# Patient Record
Sex: Female | Born: 1965 | Race: White | Hispanic: No | Marital: Married | Smoking: Never smoker
Health system: Southern US, Community
[De-identification: ages and names within clinical notes are randomized; demographics above are authoritative.]

## PROBLEM LIST (undated history)

## (undated) DIAGNOSIS — G43009 Migraine without aura, not intractable, without status migrainosus: Principal | ICD-10-CM

## (undated) DIAGNOSIS — I10 Essential (primary) hypertension: Secondary | ICD-10-CM

## (undated) DIAGNOSIS — N200 Calculus of kidney: Secondary | ICD-10-CM

## (undated) DIAGNOSIS — G43909 Migraine, unspecified, not intractable, without status migrainosus: Secondary | ICD-10-CM

## (undated) HISTORY — DX: Migraine without aura, not intractable, without status migrainosus: G43.009

## (undated) HISTORY — PX: PARTIAL HYSTERECTOMY: SHX80

## (undated) HISTORY — DX: Migraine, unspecified, not intractable, without status migrainosus: G43.909

## (undated) HISTORY — DX: Calculus of kidney: N20.0

---

## 1993-07-03 HISTORY — PX: OTHER SURGICAL HISTORY: SHX169

## 2000-09-18 ENCOUNTER — Other Ambulatory Visit: Admission: RE | Admit: 2000-09-18 | Discharge: 2000-09-18 | Payer: Self-pay | Admitting: Obstetrics & Gynecology

## 2002-05-12 ENCOUNTER — Other Ambulatory Visit: Admission: RE | Admit: 2002-05-12 | Discharge: 2002-05-12 | Payer: Self-pay | Admitting: Obstetrics & Gynecology

## 2003-08-17 ENCOUNTER — Other Ambulatory Visit: Admission: RE | Admit: 2003-08-17 | Discharge: 2003-08-17 | Payer: Self-pay | Admitting: Obstetrics & Gynecology

## 2004-02-03 ENCOUNTER — Emergency Department (HOSPITAL_COMMUNITY): Admission: EM | Admit: 2004-02-03 | Discharge: 2004-02-03 | Payer: Self-pay | Admitting: Emergency Medicine

## 2004-02-22 ENCOUNTER — Encounter: Admission: RE | Admit: 2004-02-22 | Discharge: 2004-02-22 | Payer: Self-pay | Admitting: Specialist

## 2004-11-21 ENCOUNTER — Other Ambulatory Visit: Admission: RE | Admit: 2004-11-21 | Discharge: 2004-11-21 | Payer: Self-pay | Admitting: Obstetrics & Gynecology

## 2008-08-26 ENCOUNTER — Emergency Department (HOSPITAL_COMMUNITY): Admission: EM | Admit: 2008-08-26 | Discharge: 2008-08-26 | Payer: Self-pay | Admitting: Family Medicine

## 2010-10-18 LAB — POCT URINALYSIS DIP (DEVICE)
Bilirubin Urine: NEGATIVE
Glucose, UA: NEGATIVE mg/dL
Ketones, ur: NEGATIVE mg/dL
Nitrite: NEGATIVE
Protein, ur: NEGATIVE mg/dL
Specific Gravity, Urine: 1.02 (ref 1.005–1.030)
Urobilinogen, UA: 1 mg/dL (ref 0.0–1.0)
pH: 6 (ref 5.0–8.0)

## 2010-10-18 LAB — CBC
HCT: 39.3 % (ref 36.0–46.0)
Hemoglobin: 14 g/dL (ref 12.0–15.0)
MCHC: 35.6 g/dL (ref 30.0–36.0)
MCV: 94.3 fL (ref 78.0–100.0)
Platelets: 285 10*3/uL (ref 150–400)
RBC: 4.17 MIL/uL (ref 3.87–5.11)
RDW: 12.6 % (ref 11.5–15.5)
WBC: 9.3 10*3/uL (ref 4.0–10.5)

## 2010-10-18 LAB — POCT I-STAT, CHEM 8
BUN: 17 mg/dL (ref 6–23)
Calcium, Ion: 1.2 mmol/L (ref 1.12–1.32)
Chloride: 107 mEq/L (ref 96–112)
Creatinine, Ser: 0.6 mg/dL (ref 0.4–1.2)
Glucose, Bld: 90 mg/dL (ref 70–99)
HCT: 42 % (ref 36.0–46.0)
Hemoglobin: 14.3 g/dL (ref 12.0–15.0)
Potassium: 3.6 mEq/L (ref 3.5–5.1)
Sodium: 142 mEq/L (ref 135–145)
TCO2: 24 mmol/L (ref 0–100)

## 2012-09-19 ENCOUNTER — Other Ambulatory Visit: Payer: Self-pay | Admitting: Obstetrics & Gynecology

## 2012-09-19 DIAGNOSIS — R928 Other abnormal and inconclusive findings on diagnostic imaging of breast: Secondary | ICD-10-CM

## 2012-09-30 ENCOUNTER — Ambulatory Visit
Admission: RE | Admit: 2012-09-30 | Discharge: 2012-09-30 | Disposition: A | Discharge status: Home / Self Care | Payer: PRIVATE HEALTH INSURANCE | Source: Ambulatory Visit | Attending: Obstetrics & Gynecology | Admitting: Obstetrics & Gynecology

## 2012-09-30 DIAGNOSIS — R928 Other abnormal and inconclusive findings on diagnostic imaging of breast: Secondary | ICD-10-CM

## 2013-02-21 ENCOUNTER — Telehealth: Payer: Self-pay | Admitting: Neurology

## 2013-02-21 NOTE — Telephone Encounter (Signed)
I have not gotten a request for her.  This is a former [facility] patient.  (pharmacy is likely sending request under his name, which rejects in the system since he is not an active provider) She has not been seen in over 1 year and needs to be assigned to a new provider and schedule appt.  Forwarding message to nurse for assignment.

## 2013-02-24 ENCOUNTER — Telehealth: Payer: Self-pay | Admitting: Neurology

## 2013-02-24 MED ORDER — ELETRIPTAN HYDROBROMIDE 40 MG PO TABS: 40.0000 mg | ORAL_TABLET | Freq: Two times a day (BID) | ORAL | Status: DC | PRN

## 2013-02-24 NOTE — Telephone Encounter (Signed)
Reassigned to Dr. Willis.  Sent to scheduling.  

## 2013-02-24 NOTE — Telephone Encounter (Signed)
The patient called back again for the Relpax Rx. I called in the Rx to the pharmacy. 10 tablets and 0 refills. The patient needs a RV.

## 2013-02-24 NOTE — Telephone Encounter (Signed)
The patient called. She is a patient of Dr. Sandria Manly, and she has not been seen in over one year. The patient is out of her Relpax, I will call the same. The patient needs an appointment with someone in our office.

## 2013-02-25 NOTE — Telephone Encounter (Signed)
Dr Anne Hahn documented he already sent Rx, and that patient should schedule an appt.  It appears Rebecca Alvarado has sent this message to the schedulers regarding appt.

## 2013-07-14 ENCOUNTER — Other Ambulatory Visit: Payer: Self-pay

## 2013-07-14 DIAGNOSIS — Z1231 Encounter for screening mammogram for malignant neoplasm of breast: Secondary | ICD-10-CM

## 2013-10-07 ENCOUNTER — Ambulatory Visit: Payer: PRIVATE HEALTH INSURANCE

## 2013-10-14 ENCOUNTER — Ambulatory Visit
Admission: RE | Admit: 2013-10-14 | Discharge: 2013-10-14 | Disposition: A | Discharge status: Home / Self Care | Payer: PRIVATE HEALTH INSURANCE | Source: Ambulatory Visit

## 2013-10-14 DIAGNOSIS — Z1231 Encounter for screening mammogram for malignant neoplasm of breast: Secondary | ICD-10-CM

## 2013-10-17 ENCOUNTER — Other Ambulatory Visit: Payer: Self-pay | Admitting: Obstetrics & Gynecology

## 2013-10-17 DIAGNOSIS — R928 Other abnormal and inconclusive findings on diagnostic imaging of breast: Secondary | ICD-10-CM

## 2013-10-28 ENCOUNTER — Ambulatory Visit
Admission: RE | Admit: 2013-10-28 | Discharge: 2013-10-28 | Disposition: A | Discharge status: Home / Self Care | Payer: PRIVATE HEALTH INSURANCE | Source: Ambulatory Visit | Attending: Obstetrics & Gynecology | Admitting: Obstetrics & Gynecology

## 2013-10-28 DIAGNOSIS — R928 Other abnormal and inconclusive findings on diagnostic imaging of breast: Secondary | ICD-10-CM

## 2013-12-30 ENCOUNTER — Encounter (INDEPENDENT_AMBULATORY_CARE_PROVIDER_SITE_OTHER): Payer: Self-pay

## 2013-12-30 ENCOUNTER — Encounter: Payer: Self-pay | Admitting: *Deleted

## 2013-12-30 ENCOUNTER — Ambulatory Visit (INDEPENDENT_AMBULATORY_CARE_PROVIDER_SITE_OTHER): Payer: PRIVATE HEALTH INSURANCE | Admitting: Neurology

## 2013-12-30 VITALS — BP 129/81 | HR 80 | Ht 67.0 in | Wt 204.0 lb

## 2013-12-30 DIAGNOSIS — G43009 Migraine without aura, not intractable, without status migrainosus: Secondary | ICD-10-CM

## 2013-12-30 HISTORY — DX: Migraine without aura, not intractable, without status migrainosus: G43.009

## 2013-12-30 MED ORDER — ZONISAMIDE 50 MG PO CAPS: 150.0000 mg | ORAL_CAPSULE | Freq: Every day | ORAL | Status: DC

## 2013-12-30 MED ORDER — RIZATRIPTAN BENZOATE 10 MG PO TABS: 10.0000 mg | ORAL_TABLET | Freq: Three times a day (TID) | ORAL | Status: DC | PRN

## 2013-12-30 MED ORDER — METOCLOPRAMIDE HCL 10 MG PO TABS: 10.0000 mg | ORAL_TABLET | Freq: Three times a day (TID) | ORAL | Status: DC

## 2013-12-30 NOTE — Patient Instructions (Signed)
Migraine Headache °A migraine headache is very bad, throbbing pain on one or both sides of your head. Talk to your doctor about what things may bring on (trigger) your migraine headaches. °HOME CARE °· Only take medicines as told by your doctor. °· Lie down in a dark, quiet room when you have a migraine. °· Keep a journal to find out if certain things bring on migraine headaches. For example, write down: °¨ What you eat and drink. °¨ How much sleep you get. °¨ Any change to your diet or medicines. °· Lessen how much alcohol you drink. °· Quit smoking if you smoke. °· Get enough sleep. °· Lessen any stress in your life. °· Keep lights dim if bright lights bother you or make your migraines worse. °GET HELP RIGHT AWAY IF:  °· Your migraine becomes really bad. °· You have a fever. °· You have a stiff neck. °· You have trouble seeing. °· Your muscles are weak, or you lose muscle control. °· You lose your balance or have trouble walking. °· You feel like you will pass out (faint), or you pass out. °· You have really bad symptoms that are different than your first symptoms. °MAKE SURE YOU:  °· Understand these instructions. °· Will watch your condition. °· Will get help right away if you are not doing well or get worse. °Document Released: 03/28/2008 Document Revised: 09/11/2011 Document Reviewed: 02/24/2013 °ExitCare® Patient Information ©2015 [employer]. This information is not intended to replace advice given to you by your health care Rebecca Alvarado. Make sure you discuss any questions you have with your health care Rebecca Alvarado. ° °

## 2013-12-30 NOTE — Progress Notes (Signed)
Reason for visit: Migraine headaches  Rebecca Alvarado is an [age] y.o. female  History of present illness:  Rebecca Alvarado is a 48 year old right-handed white female with a history of migraine headaches. The patient has been followed in the past through this office for migraine, last seen by Dr. Sandria ManlyLove. The patient has been on Topamax in the past, but she could not tolerate this. She currently is on Zonegran with some benefit, only taking 100 mg daily. She is still having 1 or 2 headaches a week, and she will miss days of work on average once a month. She may have some nausea and vomiting with the headaches, and she will take Reglan for this. The headaches may last anywhere from 30 minutes to 6 hours. She could not afford the Relpax as it was not covered by her insurance. Imitrex did not work well for her headaches. She returns to this office today for an evaluation. She indicates that peanuts, chocolate, and perfume odors may activate headache. The patient has photophobia, not phonophobia with the headaches. Prior records indicate that the patient was on propranolol, but she does not recall being on this medication. Her daughter has a history of migraine.  Past Medical History  Diagnosis Date  . Migraine   . Renal calculi   . Migraine without aura, without mention of intractable migraine without mention of status migrainosus 12/30/2013    Past Surgical History  Procedure Laterality Date  . Partial hysterectomy    . Scar tissue removal  1995    leg    Family History  Problem Relation Age of Onset  . Neuropathy Father   . Migraines Mother   . Migraines Daughter     Social history:  reports that she has never smoked. She has never used smokeless tobacco. She reports that she drinks alcohol. She reports that she does not use illicit drugs.   No Known Allergies  Medications:  No current outpatient prescriptions on file prior to visit.   No current facility-administered medications on file  prior to visit.    ROS:  Out of a complete 14 system review of symptoms, the patient complains only of the following symptoms, and all other reviewed systems are negative.  Light sensitivity Nausea Frequent waking Headache  Blood pressure 129/81, pulse 80, height 5\' 7"  (1.702 m), weight 204 lb (92.534 kg).  Physical Exam  General: The patient is alert and cooperative at the time of the examination. The patient is minimally to moderately obese.  Skin: No significant peripheral edema is noted.   Neurologic Exam  Mental status: The patient is oriented x 3.  Cranial nerves: Facial symmetry is present. Speech is normal, no aphasia or dysarthria is noted. Extraocular movements are full. Visual fields are full.  Motor: The patient has good strength in all 4 extremities.  Sensory examination: Soft touch sensation is symmetric on the face, arms, and legs.  Coordination: The patient has good finger-nose-finger and heel-to-shin bilaterally.  Gait and station: The patient has a normal gait. Tandem gait is normal. Romberg is negative. No drift is seen.  Reflexes: Deep tendon reflexes are symmetric.   Assessment/Plan:  One. Migraine headache  The patient will be increased on the Zonegran taking 150 mg at night. She will try the Maxalt which also has generic, possibly be insurance will cover this medication. A refill on the metoclopramide was given. She will followup in about 4 months. The Zonegran can be increased in the future if needed.  Marlan Palau. Keith Doloris Servantes MD 12/30/2013 9:11 PM  [facility] [address] [place], [place] [id number]  Phone [phone] Fax [phone]

## 2014-05-05 ENCOUNTER — Encounter: Payer: Self-pay | Admitting: Adult Health

## 2014-05-05 ENCOUNTER — Ambulatory Visit (INDEPENDENT_AMBULATORY_CARE_PROVIDER_SITE_OTHER): Payer: PRIVATE HEALTH INSURANCE | Admitting: Adult Health

## 2014-05-05 VITALS — BP 112/73 | HR 94 | Ht 68.5 in | Wt 219.0 lb

## 2014-05-05 DIAGNOSIS — G43009 Migraine without aura, not intractable, without status migrainosus: Secondary | ICD-10-CM

## 2014-05-05 MED ORDER — ZONISAMIDE 50 MG PO CAPS: 150.0000 mg | ORAL_CAPSULE | Freq: Every day | ORAL | Status: DC

## 2014-05-05 NOTE — Progress Notes (Signed)
I have read the note, and I agree with the clinical assessment and plan.  Radie Berges KEITH   

## 2014-05-05 NOTE — Progress Notes (Signed)
PATIENT: Rebecca Alvarado DOB: 20-Sep-1965  REASON FOR VISIT: follow up HISTORY FROM: patient  HISTORY OF PRESENT ILLNESS: Rebecca Alvarado is a 48 year old female with a history of migraine headaches. She returns today for followup. She is currently taking Zonegran 150 mg at bedtime. She reports that her headaches have improved. She reports that she has approximately 1 headache a week. She will sometimes have nausea but no vomiting. + for photophobia and phonophobia. Normally she has to lay down when she gets a migraine. Her migraines can last 30 minutes to 4 hours. Her headaches start in the neck and radiate to the left side of her head. Her pain with her headaches is normally a 10/10. She uses maxalt for acute therapy and that works well for her. She did have a kidney stone a couple of months ago.   HISTORY 12/30/13 (CW): 48 year old right-handed white female with a history of migraine headaches. The patient has been followed in the past through this office for migraine, last seen by Dr. Sandria ManlyLove. The patient has been on Topamax in the past, but she could not tolerate this. She currently is on Zonegran with some benefit, only taking 100 mg daily. She is still having 1 or 2 headaches a week, and she will miss days of work on average once a month. She may have some nausea and vomiting with the headaches, and she will take Reglan for this. The headaches may last anywhere from 30 minutes to 6 hours. She could not afford the Relpax as it was not covered by her insurance. Imitrex did not work well for her headaches. She returns to this office today for an evaluation. She indicates that peanuts, chocolate, and perfume odors may activate headache. The patient has photophobia, not phonophobia with the headaches. Prior records indicate that the patient was on propranolol, but she does not recall being on this medication. Her daughter has a history of migraine.  REVIEW OF SYSTEMS: Full 14 system review of systems  performed and notable only for:  Constitutional: N/A  Eyes: light sensitivity and blurred vision Ear/Nose/Throat: N/A  Skin: N/A  Cardiovascular: N/A  Respiratory: N/A  Gastrointestinal: N/A  Genitourinary: N/A Hematology/Lymphatic: N/A  Endocrine: N/A Musculoskeletal:N/A  Allergy/Immunology: N/A  Neurological: headache Psychiatric: N/A Sleep: N/A   ALLERGIES: Allergies  Allergen Reactions  . Topamax [Topiramate]     parethesias    HOME MEDICATIONS: Outpatient Prescriptions Prior to Visit  Medication Sig Dispense Refill  . estradiol (VIVELLE-DOT) 0.1 MG/24HR patch Place 1 patch onto the skin 2 (two) times a week.    . metoCLOPramide (REGLAN) 10 MG tablet Take 1 tablet (10 mg total) by mouth 3 (three) times daily before meals. 30 tablet 5  . rizatriptan (MAXALT) 10 MG tablet Take 1 tablet (10 mg total) by mouth 3 (three) times daily as needed for migraine. May repeat in 2 hours if needed 12 tablet 5  . zonisamide (ZONEGRAN) 50 MG capsule Take 3 capsules (150 mg total) by mouth daily. 90 capsule 5   No facility-administered medications prior to visit.    PAST MEDICAL HISTORY: Past Medical History  Diagnosis Date  . Migraine   . Renal calculi   . Migraine without aura, without mention of intractable migraine without mention of status migrainosus 12/30/2013    PAST SURGICAL HISTORY: Past Surgical History  Procedure Laterality Date  . Partial hysterectomy    . Scar tissue removal  1995    leg    FAMILY HISTORY: Family History  Problem Relation Age of Onset  . Neuropathy Father   . Migraines Mother   . Migraines Daughter     SOCIAL HISTORY: History   Social History  . Marital Status: Married    Spouse Name: Henreitta CeaGarry     Number of Children: 2  . Years of Education: college   Occupational History  . hair stylist    Social History Main Topics  . Smoking status: Never Smoker   . Smokeless tobacco: Never Used  . Alcohol Use: Yes     Comment: occasional  .  Drug Use: No  . Sexual Activity: Not on file   Other Topics Concern  . Not on file   Social History Narrative   Patient lives at home with husband Henreitta CeaGarry.    Patient has 2 children.    Patient has a Trade school education.    Patient is right handed.       PHYSICAL EXAM  Filed Vitals:   05/05/14 1333  BP: 112/73  Pulse: 94  Height: 5' 8.5" (1.74 m)  Weight: 219 lb (99.338 kg)   Body mass index is 32.81 kg/(m^2).  Generalized: Well developed, in no acute distress   Neurological examination  Mentation: Alert oriented to time, place, history taking. Follows all commands speech and language fluent Cranial nerve II-XII: Pupils were equal round reactive to light. Extraocular movements were full, visual field were full on confrontational test. Facial sensation and strength were normal.  Uvula tongue midline. Head turning and shoulder shrug  were normal and symmetric. Motor: The motor testing reveals 5 over 5 strength of all 4 extremities. Good symmetric motor tone is noted throughout.  Sensory: Sensory testing is intact to soft touch on all 4 extremities. No evidence of extinction is noted.  Coordination: Cerebellar testing reveals good finger-nose-finger and heel-to-shin bilaterally.  Gait and station: Gait is normal. Tandem gait is normal. Romberg is negative. No drift is seen.  Reflexes: Deep tendon reflexes are symmetric and normal bilaterally.    DIAGNOSTIC DATA (LABS, IMAGING, TESTING) - I reviewed patient records, labs, notes, testing and imaging myself where available.  Lab Results  Component Value Date   WBC 9.3 08/26/2008   HGB 14.3 08/26/2008   HCT 42.0 08/26/2008   MCV 94.3 08/26/2008   PLT 285 08/26/2008      Component Value Date/Time   NA 142 08/26/2008 1838   K 3.6 08/26/2008 1838   CL 107 08/26/2008 1838   GLUCOSE 90 08/26/2008 1838   BUN 17 08/26/2008 1838   CREATININE 0.6 08/26/2008 1838      ASSESSMENT AND PLAN 48 y.o. year old female  has a  past medical history of Migraine; Renal calculi; and Migraine without aura, without mention of intractable migraine without mention of status migrainosus (12/30/2013). here with:  1. Migraine  Patient's migraines have improved on Zonegran. Will continue Zonegran 150 mg daily If the patient has any additional kidney stones then she will have to be taken off the medication.  Follow-up in 6 months or sooner if needed.  Butch PennyMegan Thresia Ramanathan, MSN, NP-C 05/05/2014, 1:46 PM [facility] [address], [address] [place], [place] [address] [phone]  Note: This document was prepared with digital dictation and possible smart phrase technology. Any transcriptional errors that result from this process are unintentional.

## 2014-05-05 NOTE — Patient Instructions (Signed)

## 2014-07-22 ENCOUNTER — Other Ambulatory Visit: Payer: Self-pay | Admitting: Neurology

## 2014-07-22 ENCOUNTER — Telehealth: Payer: Self-pay | Admitting: Adult Health

## 2014-07-22 NOTE — Telephone Encounter (Signed)
Patient stated Rx zonisamide (ZONEGRAN) 50 MG capsule is causing Kidney Stones.  Patient has two additional kidney stones.  Questioning if there's alternative medication.  Please call and advise.

## 2014-07-27 NOTE — Telephone Encounter (Signed)
I returned the patient's call and left a VM for the patient to call the office at her convenience.

## 2014-07-27 NOTE — Telephone Encounter (Signed)
Wants to know if there is a alternative medication the one she is on is giving her kidney stones

## 2014-07-28 NOTE — Telephone Encounter (Signed)
I called the patient. She states that she just recently passed a kidney stone.She also has 2 additional kidney stones. She states that her urologist thinks this is due to the Zonegran. She states she has had one kidney stone approximately 10 years ago when she was on Topamax. She states that the sonogram has really controlled her migraines. She is hesitant to come off the medication. I have advised her to consult with her urologist. The only way to be sure that the kidney stones is due to the medication is to send the stone off for analysis. I'm not sure of her urologist  is amenable to this. She states that she will state her urologist and get back with us.

## 2014-08-03 HISTORY — PX: ELBOW SURGERY: SHX618

## 2014-09-18 ENCOUNTER — Other Ambulatory Visit: Payer: Self-pay

## 2014-09-18 DIAGNOSIS — Z1231 Encounter for screening mammogram for malignant neoplasm of breast: Secondary | ICD-10-CM

## 2014-11-09 ENCOUNTER — Ambulatory Visit
Admission: RE | Admit: 2014-11-09 | Discharge: 2014-11-09 | Disposition: A | Discharge status: Home / Self Care | Payer: 59 | Source: Ambulatory Visit

## 2014-11-09 DIAGNOSIS — Z1231 Encounter for screening mammogram for malignant neoplasm of breast: Secondary | ICD-10-CM

## 2014-12-21 ENCOUNTER — Ambulatory Visit: Payer: PRIVATE HEALTH INSURANCE | Admitting: Adult Health

## 2015-02-02 ENCOUNTER — Encounter: Payer: Self-pay | Admitting: Adult Health

## 2015-02-02 ENCOUNTER — Ambulatory Visit (INDEPENDENT_AMBULATORY_CARE_PROVIDER_SITE_OTHER): Payer: Managed Care, Other (non HMO) | Admitting: Adult Health

## 2015-02-02 VITALS — BP 108/72 | HR 78 | Ht 68.5 in | Wt 217.0 lb

## 2015-02-02 DIAGNOSIS — G43009 Migraine without aura, not intractable, without status migrainosus: Secondary | ICD-10-CM

## 2015-02-02 DIAGNOSIS — G43909 Migraine, unspecified, not intractable, without status migrainosus: Secondary | ICD-10-CM | POA: Insufficient documentation

## 2015-02-02 MED ORDER — ELETRIPTAN HYDROBROMIDE 40 MG PO TABS: 40.0000 mg | ORAL_TABLET | Freq: Two times a day (BID) | ORAL | Status: DC | PRN

## 2015-02-02 NOTE — Progress Notes (Signed)
PATIENT: Rebecca Alvarado DOB: April 09, 1966  REASON FOR VISIT: follow up- migraine HISTORY FROM: patient  HISTORY OF PRESENT ILLNESS: Rebecca Alvarado is a 49 year old female with a history of migraine headaches. She returns today for follow-up. She continues to take Zonegran 150 mg at bedtime. She reports that her headaches have improved significantly. She has approximately 3-4 headaches a month. She states that this is tolerable. She states that her headaches usually occurs in the back of the neck and travels to the left side above the left eye. She confirms photophobia and phonophobia. She states depending on the severity she may have nausea and vomiting. She states that normally she can take a Maxalt and by the second dose her headache starts to resolve. She states in the past she has taken Relpax and that worked really well however insurance would not cover the medication. She states that she is not had any additional kidney stones. She has an appointment in October with her urologist for follow-up. She returns today for an evaluation  HISTORY 05/05/14: Rebecca Alvarado is a 49 year old female with a history of migraine headaches. She returns today for followup. She is currently taking Zonegran 150 mg at bedtime. She reports that her headaches have improved. She reports that she has approximately 1 headache a week. She will sometimes have nausea but no vomiting. + for photophobia and phonophobia. Normally she has to lay down when she gets a migraine. Her migraines can last 30 minutes to 4 hours. Her headaches start in the neck and radiate to the left side of her head. Her pain with her headaches is normally a 10/10. She uses maxalt for acute therapy and that works well for her. She did have a kidney stone a couple of months ago.   HISTORY 12/30/13 (CW): 49 year old right-handed white female with a history of migraine headaches. The patient has been followed in the past through this office for migraine, last  seen by Dr. Sandria Manly. The patient has been on Topamax in the past, but she could not tolerate this. She currently is on Zonegran with some benefit, only taking 100 mg daily. She is still having 1 or 2 headaches a week, and she will miss days of work on average once a month. She may have some nausea and vomiting with the headaches, and she will take Reglan for this. The headaches may last anywhere from 30 minutes to 6 hours. She could not afford the Relpax as it was not covered by her insurance. Imitrex did not work well for her headaches. She returns to this office today for an evaluation. She indicates that peanuts, chocolate, and perfume odors may activate headache. The patient has photophobia, not phonophobia with the headaches. Prior records indicate that the patient was on propranolol, but she does not recall being on this medication. Her daughter has a history of migraine.   REVIEW OF SYSTEMS: Out of a complete 14 system review of symptoms, the patient complains only of the following symptoms, and all other reviewed systems are negative.  Light sensitivity, headache  ALLERGIES: Allergies  Allergen Reactions  . Topamax [Topiramate]     parethesias    HOME MEDICATIONS: Outpatient Prescriptions Prior to Visit  Medication Sig Dispense Refill  . estradiol (VIVELLE-DOT) 0.1 MG/24HR patch Place 1 patch onto the skin 2 (two) times a week.    . metoCLOPramide (REGLAN) 10 MG tablet Take 1 tablet (10 mg total) by mouth 3 (three) times daily before meals. 30 tablet 5  .  rizatriptan (MAXALT) 10 MG tablet take 1 tablet by mouth three times a day if needed for migraines MAY REPEAT IN 2 HOURS IF NEEDED 12 tablet 5  . zonisamide (ZONEGRAN) 50 MG capsule Take 3 capsules (150 mg total) by mouth daily. 90 capsule 5  . TRANSDERM-SCOP 1 MG/3DAYS   0   No facility-administered medications prior to visit.    PAST MEDICAL HISTORY: Past Medical History  Diagnosis Date  . Migraine   . Renal calculi   .  Migraine without aura, without mention of intractable migraine without mention of status migrainosus 12/30/2013    PAST SURGICAL HISTORY: Past Surgical History  Procedure Laterality Date  . Partial hysterectomy    . Scar tissue removal  1995    leg    FAMILY HISTORY: Family History  Problem Relation Age of Onset  . Neuropathy Father   . Migraines Mother   . Migraines Daughter     SOCIAL HISTORY: History   Social History  . Marital Status: Married    Spouse Name: Henreitta Cea   . Number of Children: 2  . Years of Education: college   Occupational History  . hair stylist    Social History Main Topics  . Smoking status: Never Smoker   . Smokeless tobacco: Never Used  . Alcohol Use: 0.0 oz/week    0 Standard drinks or equivalent per week     Comment: occasional  . Drug Use: No  . Sexual Activity: Not on file   Other Topics Concern  . Not on file   Social History Narrative   Patient lives at home with husband Henreitta Cea.    Patient has 2 children.    Patient has a Trade school education.    Patient is right handed.       PHYSICAL EXAM  Filed Vitals:   02/02/15 0759  BP: 108/72  Pulse: 78  Height: 5' 8.5" (1.74 m)  Weight: 217 lb (98.431 kg)   Body mass index is 32.51 kg/(m^2).  Generalized: Well developed, in no acute distress   Neurological examination  Mentation: Alert oriented to time, place, history taking. Follows all commands speech and language fluent Cranial nerve II-XII: Pupils were equal round reactive to light. Extraocular movements were full, visual field were full on confrontational test. Facial sensation and strength were normal. Uvula tongue midline. Head turning and shoulder shrug  were normal and symmetric. Motor: The motor testing reveals 5 over 5 strength of all 4 extremities. Good symmetric motor tone is noted throughout.  Sensory: Sensory testing is intact to soft touch on all 4 extremities. No evidence of extinction is noted.  Coordination:  Cerebellar testing reveals good finger-nose-finger and heel-to-shin bilaterally.  Gait and station: Gait is normal. Tandem gait is normal. Romberg is negative. No drift is seen.  Reflexes: Deep tendon reflexes are symmetric and normal bilaterally.   DIAGNOSTIC DATA (LABS, IMAGING, TESTING) - I reviewed patient records, labs, notes, testing and imaging myself where available.     ASSESSMENT AND PLAN 49 y.o. year old female  has a past medical history of Migraine; Renal calculi; and Migraine without aura, without mention of intractable migraine without mention of status migrainosus (12/30/2013). here with:  1. Migraine headaches  The patient will continue Zonegran 150 mg daily. I have given the patient a prescription for Relpax with a discount card. If the patient has any additional kidney stones she should let us know. If her symptoms worsen or she develops new symptoms she should let us  know. She will follow-up in 6 months or sooner if needed.  Butch Penny, MSN, NP-C 02/02/2015, 8:05 AM [facility] [address], [address] [place], [place] [address] [phone]  Note: This document was prepared with digital dictation and possible smart phrase technology. Any transcriptional errors that result from this process are unintentional.

## 2015-02-02 NOTE — Progress Notes (Signed)
I have read the note, and I agree with the clinical assessment and plan.  WILLIS,CHARLES KEITH   

## 2015-02-02 NOTE — Patient Instructions (Signed)
Continue Zonegran Try Relpax. If your symptoms worsen or you develop new symptoms please let us know.

## 2015-02-05 ENCOUNTER — Other Ambulatory Visit: Payer: Self-pay | Admitting: Adult Health

## 2015-05-21 ENCOUNTER — Other Ambulatory Visit: Payer: Self-pay | Admitting: Neurology

## 2015-06-10 ENCOUNTER — Other Ambulatory Visit: Payer: Self-pay | Admitting: Neurology

## 2015-07-06 ENCOUNTER — Telehealth: Payer: Self-pay | Admitting: Adult Health

## 2015-07-06 MED ORDER — RIZATRIPTAN BENZOATE 10 MG PO TABS: 10.0000 mg | ORAL_TABLET | ORAL | Status: DC | PRN

## 2015-07-06 NOTE — Telephone Encounter (Signed)
Ok to resume Maxalt and discontinue Relpax.

## 2015-07-06 NOTE — Telephone Encounter (Signed)
Pt called and would like a rx for rizatriptan 10 mg. She was told that if Relpax does not work that she could have something else. May call [phone]

## 2015-07-06 NOTE — Telephone Encounter (Signed)
Med list has been updated, Rx has been sent.  I called back to advise.  Got no answer.  Left message.

## 2015-08-03 ENCOUNTER — Ambulatory Visit: Payer: PRIVATE HEALTH INSURANCE | Admitting: Adult Health

## 2015-08-09 ENCOUNTER — Ambulatory Visit: Payer: PRIVATE HEALTH INSURANCE | Admitting: Adult Health

## 2015-08-24 ENCOUNTER — Encounter: Payer: Self-pay | Admitting: Adult Health

## 2015-08-24 ENCOUNTER — Ambulatory Visit (INDEPENDENT_AMBULATORY_CARE_PROVIDER_SITE_OTHER): Payer: Commercial Managed Care - PPO | Admitting: Adult Health

## 2015-08-24 ENCOUNTER — Telehealth: Payer: Self-pay | Admitting: Adult Health

## 2015-08-24 VITALS — BP 107/72 | HR 100 | Ht 68.5 in | Wt 222.0 lb

## 2015-08-24 DIAGNOSIS — G43009 Migraine without aura, not intractable, without status migrainosus: Secondary | ICD-10-CM

## 2015-08-24 MED ORDER — ELETRIPTAN HYDROBROMIDE 40 MG PO TABS: 40.0000 mg | ORAL_TABLET | Freq: Two times a day (BID) | ORAL | Status: DC | PRN

## 2015-08-24 MED ORDER — ZONISAMIDE 50 MG PO CAPS: 150.0000 mg | ORAL_CAPSULE | Freq: Every day | ORAL | Status: DC

## 2015-08-24 NOTE — Progress Notes (Signed)
I have read the note, and I agree with the clinical assessment and plan.  Mical Brun KEITH   

## 2015-08-24 NOTE — Patient Instructions (Signed)
Continue Zonegran If Relpax is affordable we can switch back to this. Let me know For now continue Maxalt.  If your symptoms worsen or you develop new symptoms please let us know.

## 2015-08-24 NOTE — Telephone Encounter (Signed)
Patient will discontinue Maxalt. I have  placed order for Relpax. Please fax the prescription to the pharmacy and advised the patient that her prescription has been sent.

## 2015-08-24 NOTE — Telephone Encounter (Signed)
Patient is calling and states that the discount card you gave her this morning for Rx relpax will be accepted by [facility] and would like to go ahead and have the Rx filled. Thanks!

## 2015-08-24 NOTE — Telephone Encounter (Signed)
Received fax confirmation to [facility] for relpax.  Informed pt.

## 2015-08-24 NOTE — Progress Notes (Signed)
PATIENT: Rebecca Alvarado DOB: Apr 12, 1966  REASON FOR VISIT: follow up- migraine HISTORY FROM: patient  HISTORY OF PRESENT ILLNESS: Rebecca Alvarado is a 50 year old female with a history of migraine headaches. She returns today for follow-up. She reports that her headaches have remained relatively the same. She continues to take Zonegran 150 mg at bedtime. She continues to have approximately 3 headaches a month. She states that the Maxalt normally resolves her headaches however she prefers Relpax. She was unable to get Relpax at the last visit due to her insurance. The patient denies any kidney stones since last visit. Denies any new medical issues. She returns today for an evaluation.  HISTORY 02/02/15: Rebecca Alvarado is a 50 year old female with a history of migraine headaches. She returns today for follow-up. She continues to take Zonegran 150 mg at bedtime. She reports that her headaches have improved significantly. She has approximately 3-4 headaches a month. She states that this is tolerable. She states that her headaches usually occurs in the back of the neck and travels to the left side above the left eye. She confirms photophobia and phonophobia. She states depending on the severity she may have nausea and vomiting. She states that normally she can take a Maxalt and by the second dose her headache starts to resolve. She states in the past she has taken Relpax and that worked really well however insurance would not cover the medication. She states that she is not had any additional kidney stones. She has an appointment in October with her urologist for follow-up. She returns today for an evaluation   REVIEW OF SYSTEMS: Out of a complete 14 system review of symptoms, the patient complains only of the following symptoms, and all other reviewed systems are negative.  Light sensitivity, double vision, blurred vision, nausea, frequent waking, headache, numbness  ALLERGIES: Allergies  Allergen  Reactions  . Topamax [Topiramate]     parethesias    HOME MEDICATIONS: Outpatient Prescriptions Prior to Visit  Medication Sig Dispense Refill  . estradiol (VIVELLE-DOT) 0.1 MG/24HR patch Place 1 patch onto the skin 2 (two) times a week.    . metoCLOPramide (REGLAN) 10 MG tablet Take 1 tablet (10 mg total) by mouth 3 (three) times daily before meals. 30 tablet 5  . rizatriptan (MAXALT) 10 MG tablet Take 1 tablet (10 mg total) by mouth as needed for migraine. May repeat in 2 hours if needed (Max 3 tabs in 24 hours) 12 tablet 3  . zonisamide (ZONEGRAN) 50 MG capsule take 3 capsules by mouth once daily 90 capsule 6   No facility-administered medications prior to visit.    PAST MEDICAL HISTORY: Past Medical History  Diagnosis Date  . Migraine   . Renal calculi   . Migraine without aura, without mention of intractable migraine without mention of status migrainosus 12/30/2013    PAST SURGICAL HISTORY: Past Surgical History  Procedure Laterality Date  . Partial hysterectomy    . Scar tissue removal  1995    leg  . Elbow surgery Right 08/2014    FAMILY HISTORY: Family History  Problem Relation Age of Onset  . Neuropathy Father   . Migraines Mother   . Migraines Daughter     SOCIAL HISTORY: Social History   Social History  . Marital Status: Married    Spouse Name: Henreitta Cea   . Number of Children: 2  . Years of Education: college   Occupational History  . hair stylist    Social History Main Topics  .  Smoking status: Never Smoker   . Smokeless tobacco: Never Used  . Alcohol Use: 0.0 oz/week    0 Standard drinks or equivalent per week     Comment: occasional  . Drug Use: No  . Sexual Activity: Not on file   Other Topics Concern  . Not on file   Social History Narrative   Patient lives at home with husband Henreitta Cea.    Patient has 2 children.    Patient has a Trade school education.    Patient is right handed.       PHYSICAL EXAM  Filed Vitals:   08/24/15 0758    BP: 107/72  Pulse: 100  Height: 5' 8.5" (1.74 m)  Weight: 222 lb (100.699 kg)   Body mass index is 33.26 kg/(m^2).  Generalized: Well developed, in no acute distress   Neurological examination  Mentation: Alert oriented to time, place, history taking. Follows all commands speech and language fluent Cranial nerve II-XII: Pupils were equal round reactive to light. Extraocular movements were full, visual field were full on confrontational test. Facial sensation and strength were normal. Uvula tongue midline. Head turning and shoulder shrug  were normal and symmetric. Motor: The motor testing reveals 5 over 5 strength of all 4 extremities. Good symmetric motor tone is noted throughout.  Sensory: Sensory testing is intact to soft touch on all 4 extremities. No evidence of extinction is noted.  Coordination: Cerebellar testing reveals good finger-nose-finger and heel-to-shin bilaterally.  Gait and station: Gait is normal. Tandem gait is normal. Romberg is negative. No drift is seen.  Reflexes: Deep tendon reflexes are symmetric and normal bilaterally.   DIAGNOSTIC DATA (LABS, IMAGING, TESTING) - I reviewed patient records, labs, notes, testing and imaging myself where available.     ASSESSMENT AND PLAN 50 y.o. year old female  has a past medical history of Migraine; Renal calculi; and Migraine without aura, without mention of intractable migraine without mention of status migrainosus (12/30/2013). here with:  1. Migraine headache  Overall the patient is doing well. She will continue on Zonegran 150 mg daily. For now she will continue using Maxalt. I did provide her with a discount card for Relpax. If the discount card makes this medication affordable we will switch her to Relpax. Patient advised that if her symptoms worsen or she develops any new symptoms she will let us know. She will follow-up in one year with Dr. Anne Hahn.     Butch Penny, MSN, NP-C 08/24/2015, 8:26 AM [facility] [address] [place], [place] [address] [phone]

## 2015-10-01 ENCOUNTER — Other Ambulatory Visit: Payer: Self-pay | Admitting: Adult Health

## 2015-11-17 ENCOUNTER — Other Ambulatory Visit: Payer: Self-pay

## 2015-11-17 DIAGNOSIS — Z1231 Encounter for screening mammogram for malignant neoplasm of breast: Secondary | ICD-10-CM

## 2015-12-06 ENCOUNTER — Other Ambulatory Visit: Payer: Self-pay | Admitting: Obstetrics & Gynecology

## 2015-12-06 ENCOUNTER — Ambulatory Visit
Admission: RE | Admit: 2015-12-06 | Discharge: 2015-12-06 | Disposition: A | Discharge status: Home / Self Care | Payer: Commercial Managed Care - PPO | Source: Ambulatory Visit

## 2015-12-06 DIAGNOSIS — Z1231 Encounter for screening mammogram for malignant neoplasm of breast: Secondary | ICD-10-CM

## 2016-01-08 ENCOUNTER — Other Ambulatory Visit: Payer: Self-pay | Admitting: Adult Health

## 2016-01-31 ENCOUNTER — Telehealth: Payer: Self-pay | Admitting: Neurology

## 2016-01-31 MED ORDER — ELETRIPTAN HYDROBROMIDE 40 MG PO TABS: ORAL_TABLET | ORAL | 5 refills | Status: DC

## 2016-01-31 NOTE — Telephone Encounter (Signed)
Refills retailed as requested. 

## 2016-01-31 NOTE — Telephone Encounter (Signed)
Patient called to request renewal/refill of RELPAX 40 MG tablet, prescription has expired.

## 2016-08-22 ENCOUNTER — Ambulatory Visit (INDEPENDENT_AMBULATORY_CARE_PROVIDER_SITE_OTHER): Payer: Commercial Managed Care - PPO | Admitting: Neurology

## 2016-08-22 ENCOUNTER — Encounter: Payer: Self-pay | Admitting: Neurology

## 2016-08-22 VITALS — BP 98/60 | HR 75 | Ht 68.0 in | Wt 188.0 lb

## 2016-08-22 DIAGNOSIS — G43009 Migraine without aura, not intractable, without status migrainosus: Secondary | ICD-10-CM | POA: Diagnosis not present

## 2016-08-22 MED ORDER — ZONISAMIDE 50 MG PO CAPS: 150.0000 mg | ORAL_CAPSULE | Freq: Every day | ORAL | 3 refills | Status: DC

## 2016-08-22 MED ORDER — ELETRIPTAN HYDROBROMIDE 40 MG PO TABS: ORAL_TABLET | ORAL | 3 refills | Status: DC

## 2016-08-22 NOTE — Progress Notes (Signed)
Reason for visit: Migraine headache  Rebecca Alvarado is an 51 y.o. female  History of present illness:  Rebecca Alvarado is a 51 year old right-handed white female with a history of migraine headaches. The patient has done relatively well with her headaches since last seen. The patient has begun exercising, she has been able lose quite a bit of weight, and she has done better with the headaches having on average one a month or so. The patient claims that with Relpax, she can limit the headache within 4 or 5 hours. Sleep will help the headache. On occasion, she may get some left lower face and ear paresthesias unassociated with a visual disturbance, speech disturbance, or weakness. The paresthesias did not go into the body or arm or leg. The patient is interested in trying to lower her dose of the Zonegran. The patient does occasionally get nauseated, but she does not take medication for this. She returns for an evaluation. She finds that her daughter also has migraine headache.  Past Medical History:  Diagnosis Date  . Migraine   . Migraine without aura, without mention of intractable migraine without mention of status migrainosus 12/30/2013  . Renal calculi     Past Surgical History:  Procedure Laterality Date  . ELBOW SURGERY Right 08/2014  . PARTIAL HYSTERECTOMY    . scar tissue removal  1995   leg    Family History  Problem Relation Age of Onset  . Neuropathy Father   . Migraines Mother   . Migraines Daughter     Social history:  reports that she has never smoked. She has never used smokeless tobacco. She reports that she drinks alcohol. She reports that she does not use drugs.    Allergies  Allergen Reactions  . Topamax [Topiramate]     parethesias    Medications:  Prior to Admission medications   Medication Sig Start Date End Date Taking? Authorizing Provider  eletriptan (RELPAX) 40 MG tablet 1 tablet twice daily if needed 08/22/16  Yes York Spaniel, MD  zonisamide  (ZONEGRAN) 50 MG capsule Take 3 capsules (150 mg total) by mouth daily. 08/22/16  Yes York Spaniel, MD    ROS:  Out of a complete 14 system review of symptoms, the patient complains only of the following symptoms, and all other reviewed systems are negative.  Headache  Blood pressure 98/60, pulse 75, height 5\' 8"  (1.727 m), weight 188 lb (85.3 kg), SpO2 99 %.  Physical Exam  General: The patient is alert and cooperative at the time of the examination.  Skin: No significant peripheral edema is noted.   Neurologic Exam  Mental status: The patient is alert and oriented x 3 at the time of the examination. The patient has apparent normal recent and remote memory, with an apparently normal attention span and concentration ability.   Cranial nerves: Facial symmetry is present. Speech is normal, no aphasia or dysarthria is noted. Extraocular movements are full. Visual fields are full.  Motor: The patient has good strength in all 4 extremities.  Sensory examination: Soft touch sensation is symmetric on the face, arms, and legs.  Coordination: The patient has good finger-nose-finger and heel-to-shin bilaterally.  Gait and station: The patient has a normal gait. Tandem gait is normal. Romberg is negative. No drift is seen.  Reflexes: Deep tendon reflexes are symmetric.   Assessment/Plan:  1. Migraine headache  Overall, the patient has been doing fairly well with the headache. She is a Consulting civil engineer reducing  the dose of the Zonegran, she may go to 100 mg daily for 2 or 3 weeks, and then go to 50 mg daily. If the headaches return, she is to get back on her usual dose. A prescription was given for the Zonegran and for the Relpax. She will follow-up in one year, sooner if needed.  Marlan Palau. Keith Willis MD 08/22/2016 9:03 AM  [facility] [address] [place], [place] [id number]  Phone [phone] Fax [phone]

## 2016-12-27 ENCOUNTER — Other Ambulatory Visit: Payer: Self-pay | Admitting: Obstetrics & Gynecology

## 2016-12-27 DIAGNOSIS — Z1231 Encounter for screening mammogram for malignant neoplasm of breast: Secondary | ICD-10-CM

## 2017-01-23 ENCOUNTER — Ambulatory Visit: Payer: Commercial Managed Care - PPO

## 2017-02-13 ENCOUNTER — Ambulatory Visit
Admission: RE | Admit: 2017-02-13 | Discharge: 2017-02-13 | Disposition: A | Discharge status: Home / Self Care | Payer: Commercial Managed Care - PPO | Source: Ambulatory Visit | Attending: Obstetrics & Gynecology | Admitting: Obstetrics & Gynecology

## 2017-02-13 DIAGNOSIS — Z1231 Encounter for screening mammogram for malignant neoplasm of breast: Secondary | ICD-10-CM

## 2017-07-04 ENCOUNTER — Other Ambulatory Visit: Payer: Self-pay | Admitting: Neurology

## 2017-08-28 ENCOUNTER — Ambulatory Visit: Payer: Commercial Managed Care - PPO | Admitting: Adult Health

## 2017-08-28 ENCOUNTER — Encounter: Payer: Self-pay | Admitting: Adult Health

## 2017-08-28 VITALS — BP 111/67 | HR 73 | Ht 68.0 in | Wt 199.2 lb

## 2017-08-28 DIAGNOSIS — G43009 Migraine without aura, not intractable, without status migrainosus: Secondary | ICD-10-CM

## 2017-08-28 MED ORDER — ZONISAMIDE 50 MG PO CAPS: 150.0000 mg | ORAL_CAPSULE | Freq: Every day | ORAL | 3 refills | Status: DC

## 2017-08-28 MED ORDER — ELETRIPTAN HYDROBROMIDE 40 MG PO TABS: ORAL_TABLET | ORAL | 3 refills | Status: DC

## 2017-08-28 NOTE — Progress Notes (Signed)
PATIENT: Rebecca Alvarado DOB: 1966/01/31  REASON FOR VISIT: follow up HISTORY FROM: patient  HISTORY OF PRESENT ILLNESS: Today 08/28/17 Rebecca Alvarado is a 52 year old female with a history of migraine headaches.  She returns today for follow-up.  She continues on Relpax.  At the last visit her Zonegran dose was reduced to 50 mg daily.  She reports that she never decreased her dose for fear that the headaches come back.  She continues on Zonegran 150 mg daily.  She states that her headaches typically start in the left side of the neck.  She does have photophobia and phonophobia as well as nausea and occasional vomiting.  She states on certain occasions she will have tingling in the left side of the face before the headache begins.  She reports that her headache typically occurs with changes in barometric pressure.  She returns today for evaluation.  HISTORY 08/22/16:  Rebecca Alvarado is a 52 year old right-handed white female with a history of migraine headaches. The patient has done relatively well with her headaches since last seen. The patient has begun exercising, she has been able lose quite a bit of weight, and she has done better with the headaches having on average one a month or so. The patient claims that with Relpax, she can limit the headache within 4 or 5 hours. Sleep will help the headache. On occasion, she may get some left lower face and ear paresthesias unassociated with a visual disturbance, speech disturbance, or weakness. The paresthesias did not go into the body or arm or leg. The patient is interested in trying to lower her dose of the Zonegran. The patient does occasionally get nauseated, but she does not take medication for this. She returns for an evaluation. She finds that her daughter also has migraine headache.   REVIEW OF SYSTEMS: Out of a complete 14 system review of symptoms, the patient complains only of the following symptoms, and all other reviewed systems are  negative.  See HPI  ALLERGIES: Allergies  Allergen Reactions  . Topamax [Topiramate]     parethesias    HOME MEDICATIONS: Outpatient Medications Prior to Visit  Medication Sig Dispense Refill  . eletriptan (RELPAX) 40 MG tablet 1 tablet twice daily if needed 30 tablet 3  . MINIVELLE 0.1 MG/24HR patch Place 1 patch onto the skin 2 (two) times a week.     . zonisamide (ZONEGRAN) 50 MG capsule TAKE 3 CAPSULES BY MOUTH DAILY 270 capsule 0   No facility-administered medications prior to visit.     PAST MEDICAL HISTORY: Past Medical History:  Diagnosis Date  . Migraine   . Migraine without aura, without mention of intractable migraine without mention of status migrainosus 12/30/2013  . Renal calculi     PAST SURGICAL HISTORY: Past Surgical History:  Procedure Laterality Date  . ELBOW SURGERY Right 08/2014  . PARTIAL HYSTERECTOMY    . scar tissue removal  1995   leg    FAMILY HISTORY: Family History  Problem Relation Age of Onset  . Neuropathy Father   . Migraines Mother   . Migraines Daughter     SOCIAL HISTORY: Social History   Socioeconomic History  . Marital status: Married    Spouse name: Henreitta CeaGarry   . Number of children: 2  . Years of education: college  . Highest education level: Not on file  Social Needs  . Financial resource strain: Not on file  . Food insecurity - worry: Not on file  . Food  insecurity - inability: Not on file  . Transportation needs - medical: Not on file  . Transportation needs - non-medical: Not on file  Occupational History  . Occupation: hair stylist  Tobacco Use  . Smoking status: Never Smoker  . Smokeless tobacco: Never Used  Substance and Sexual Activity  . Alcohol use: Yes    Alcohol/week: 0.0 oz    Comment: occasional  . Drug use: No  . Sexual activity: Not on file  Other Topics Concern  . Not on file  Social History Narrative   Patient lives at home with husband Henreitta Cea.    Patient has 2 children.    Patient has a  Trade school education.    Patient is right handed.       PHYSICAL EXAM  Vitals:   08/28/17 0851  BP: 111/67  Pulse: 73  Weight: 199 lb 3.2 oz (90.4 kg)  Height: 5\' 8"  (1.727 m)   Body mass index is 30.29 kg/m.  Generalized: Well developed, in no acute distress   Neurological examination  Mentation: Alert oriented to time, place, history taking. Follows all commands speech and language fluent Cranial nerve II-XII: Pupils were equal round reactive to light. Extraocular movements were full, visual field were full on confrontational test. Facial sensation and strength were normal. Uvula tongue midline. Head turning and shoulder shrug  were normal and symmetric. Motor: The motor testing reveals 5 over 5 strength of all 4 extremities. Good symmetric motor tone is noted throughout.  Sensory: Sensory testing is intact to soft touch on all 4 extremities. No evidence of extinction is noted.  Coordination: Cerebellar testing reveals good finger-nose-finger and heel-to-shin bilaterally.  Gait and station: Gait is normal. Tandem gait is normal. Romberg is negative. No drift is seen.  Reflexes: Deep tendon reflexes are symmetric and normal bilaterally.   DIAGNOSTIC DATA (LABS, IMAGING, TESTING) - I reviewed patient records, labs, notes, testing and imaging myself where available.     ASSESSMENT AND PLAN 52 y.o. year old female  has a past medical history of Migraine, Migraine without aura, without mention of intractable migraine without mention of status migrainosus (12/30/2013), and Renal calculi. here with:  1.  Migraine headache  Overall the patient is doing well.  She will continue on Zonegran 50 mg daily.  She will continue on Relpax.  She is advised that if her headache frequency or severity increases she should let us know.  She will follow-up in 1 year or sooner if needed.  I spent 15 minutes with the patient. 50% of this time was spent discussing her medication  regimen.     Butch Penny, MSN, NP-C 08/28/2017, 9:00 AM [facility] [address], [address] [place], [place] [address] [phone]

## 2017-08-28 NOTE — Patient Instructions (Signed)
Your Plan:  Continue Zonegran 50 mg daily.  Continue Relpax for acute treatment of headache. If your symptoms worsen or you develop new symptoms please let us know.   [facility]. I hope we have been able to provide you high quality care today.  You may receive a patient satisfaction survey over the next few weeks. We would appreciate your feedback and comments so that we may continue to improve ourselves and the health of our patients.

## 2017-08-28 NOTE — Progress Notes (Signed)
I have read the note, and I agree with the clinical assessment and plan.  Rebecca Alvarado Rebecca Alvarado   

## 2018-02-11 ENCOUNTER — Encounter: Payer: Self-pay | Admitting: Adult Health

## 2018-03-08 ENCOUNTER — Other Ambulatory Visit: Payer: Self-pay | Admitting: Obstetrics & Gynecology

## 2018-03-08 DIAGNOSIS — Z1231 Encounter for screening mammogram for malignant neoplasm of breast: Secondary | ICD-10-CM

## 2018-04-02 ENCOUNTER — Ambulatory Visit
Admission: RE | Admit: 2018-04-02 | Discharge: 2018-04-02 | Disposition: A | Discharge status: Home / Self Care | Payer: Commercial Managed Care - PPO | Source: Ambulatory Visit | Attending: Obstetrics & Gynecology | Admitting: Obstetrics & Gynecology

## 2018-04-02 DIAGNOSIS — Z1231 Encounter for screening mammogram for malignant neoplasm of breast: Secondary | ICD-10-CM

## 2018-04-08 ENCOUNTER — Ambulatory Visit (INDEPENDENT_AMBULATORY_CARE_PROVIDER_SITE_OTHER): Payer: Self-pay

## 2018-04-08 ENCOUNTER — Encounter (INDEPENDENT_AMBULATORY_CARE_PROVIDER_SITE_OTHER): Payer: Self-pay | Admitting: Physician Assistant

## 2018-04-08 ENCOUNTER — Ambulatory Visit (INDEPENDENT_AMBULATORY_CARE_PROVIDER_SITE_OTHER): Payer: Commercial Managed Care - PPO | Admitting: Physician Assistant

## 2018-04-08 VITALS — Ht 68.0 in | Wt 190.0 lb

## 2018-04-08 DIAGNOSIS — M545 Low back pain, unspecified: Secondary | ICD-10-CM

## 2018-04-08 MED ORDER — CYCLOBENZAPRINE HCL 10 MG PO TABS: 10.0000 mg | ORAL_TABLET | Freq: Every day | ORAL | 0 refills | Status: DC

## 2018-04-08 MED ORDER — METHYLPREDNISOLONE 4 MG PO TBPK: ORAL_TABLET | ORAL | 0 refills | Status: DC
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
## 2018-05-06 ENCOUNTER — Ambulatory Visit (INDEPENDENT_AMBULATORY_CARE_PROVIDER_SITE_OTHER): Payer: Commercial Managed Care - PPO | Admitting: Physician Assistant

## 2018-08-29 ENCOUNTER — Ambulatory Visit: Payer: Commercial Managed Care - PPO | Admitting: Adult Health

## 2018-09-02 ENCOUNTER — Ambulatory Visit (INDEPENDENT_AMBULATORY_CARE_PROVIDER_SITE_OTHER): Payer: Commercial Managed Care - PPO | Admitting: Physician Assistant

## 2018-09-02 ENCOUNTER — Ambulatory Visit (INDEPENDENT_AMBULATORY_CARE_PROVIDER_SITE_OTHER): Payer: Commercial Managed Care - PPO

## 2018-09-02 ENCOUNTER — Encounter (INDEPENDENT_AMBULATORY_CARE_PROVIDER_SITE_OTHER): Payer: Self-pay | Admitting: Physician Assistant

## 2018-09-02 DIAGNOSIS — M25562 Pain in left knee: Secondary | ICD-10-CM

## 2018-09-02 NOTE — Progress Notes (Signed)
Office Visit Note   Patient: Rebecca Alvarado           Date of Birth: April 08, 1966           MRN: [id number] Visit Date: 09/02/2018              Requested by: Rebecca Davenport, MD [address] [place], [place] [address] PCP: Rebecca Davenport, MD   Assessment & Plan: Visit Diagnoses:  1. Acute pain of left knee     Plan: Discussed with her quad strengthening and knee friendly exercises.  She can continue to take Advil or Aleve for pain.  She will follow-up if pain persist or becomes worse.  Questions encouraged and answered at length.  Follow-Up Instructions: Return if symptoms worsen or fail to improve.   Orders:  Orders Placed This Encounter  Procedures  . XR Knee 1-2 Views Left   No orders of the defined types were placed in this encounter.     Procedures: No procedures performed   Clinical Data: No additional findings.   Subjective: Chief Complaint  Patient presents with  . Left Knee - Pain    HPI [name] returns today due to left knee pain for the last few weeks.  She is been doing a boot camp where she has been doing a lot of flexing her knee.  She has some pain lateral aspect of the knee and also the patella area.  She has sensation right knee with maybe give way but it has not given way.  Pain with going up steps.  Prolonged sitting she has to move the knee due to the pain.  She states she is laid off the exercise for a while now and the pain is starting to dissipate.  She has been taking some Advil for pain.  She describes no other mechanical symptoms.  Review of Systems  See HPI otherwise negative or noncontributory  Objective: Vital Signs: There were no vitals taken for this visit.  Physical Exam Constitutional:      Appearance: She is not ill-appearing or diaphoretic.  Pulmonary:     Effort: Pulmonary effort is normal.  Neurological:     Mental Status: She is alert and oriented to person, place, and time.     Ortho  Exam Bilateral knees full range of motion without pain.  Slight crepitus patellofemoral joint left knee with passive range of motion.  No instability valgus varus stressing.  Slight tenderness over the lateral joint line of the left knee.  Otherwise bilateral knees nontender.  McMurray's is negative bilaterally.  Manfred Shirts negative bilaterally. Specialty Comments:  No specialty comments available.  Imaging: Xr Knee 1-2 Views Left  Result Date: 09/02/2018 Bilateral knees AP lateral views: No acute fractures.  Left knee with mild patellofemoral and medial joint line changes.  Knee otherwise well located.    PMFS History: Patient Active Problem List   Diagnosis Date Noted  . Migraine   . Migraine without aura 12/30/2013   Past Medical History:  Diagnosis Date  . Migraine   . Migraine without aura, without mention of intractable migraine without mention of status migrainosus 12/30/2013  . Renal calculi     Family History  Problem Relation Age of Onset  . Neuropathy Father   . Migraines Mother   . Migraines Daughter     Past Surgical History:  Procedure Laterality Date  . ELBOW SURGERY Right 08/2014  . PARTIAL HYSTERECTOMY    . scar  tissue removal  1995   leg   Social History   Occupational History  . Occupation: hair stylist  Tobacco Use  . Smoking status: Never Smoker  . Smokeless tobacco: Never Used  Substance and Sexual Activity  . Alcohol use: Yes    Alcohol/week: 0.0 standard drinks    Comment: occasional  . Drug use: No  . Sexual activity: Not on file

## 2018-09-09 ENCOUNTER — Other Ambulatory Visit: Payer: Self-pay | Admitting: Adult Health

## 2018-10-16 ENCOUNTER — Telehealth: Payer: Self-pay | Admitting: Neurology

## 2018-10-16 NOTE — Telephone Encounter (Signed)
This is a [facility] NP patient who needs to be rescheduled due to MM being out. Patient can be called and converted to video visit with Sarah NP. °

## 2018-10-17 NOTE — Telephone Encounter (Signed)
Unable to get in contact with the patient to schedule her webex visit with Margie Ege, NP for 10/24/2018. I left a voicemail asking her to return my call. Office number was provided.

## 2018-10-28 NOTE — Telephone Encounter (Signed)
LMVM for pt to return call to convert her appt from MM/NP on 11-04-18 to other NP.

## 2018-10-31 ENCOUNTER — Encounter: Payer: Self-pay | Admitting: Neurology

## 2018-10-31 NOTE — Telephone Encounter (Signed)
Called patient today and LVM regarding her 5/4 appointment. I advised that it has been cancelled and she will need to call back to reschedule.

## 2018-10-31 NOTE — Telephone Encounter (Signed)
Due to current COVID 19 pandemic, our office is severely reducing in office visits for at least the next 2 weeks, in order to minimize the risk to our patients and healthcare providers. She consented to [email or web address].     Pt understands that although there may be some limitations with this type of visit, we will take all precautions to reduce any security or privacy concerns.  Pt understands that this will be treated like an in office visit and we will file with pt's insurance.  We will be using [email or web address] for the virtual video visit. No download needed. Will send email with instructions.  She verbalized understanding. Updated chart.  She stated not taking flexeril, concerned with SE (memory loss, etc).

## 2018-11-04 ENCOUNTER — Encounter: Payer: Self-pay | Admitting: Family Medicine

## 2018-11-04 ENCOUNTER — Other Ambulatory Visit: Payer: Self-pay

## 2018-11-04 ENCOUNTER — Ambulatory Visit: Payer: Commercial Managed Care - PPO | Admitting: Adult Health

## 2018-11-04 ENCOUNTER — Ambulatory Visit (INDEPENDENT_AMBULATORY_CARE_PROVIDER_SITE_OTHER): Payer: Commercial Managed Care - PPO | Admitting: Family Medicine

## 2018-11-04 DIAGNOSIS — G43009 Migraine without aura, not intractable, without status migrainosus: Secondary | ICD-10-CM

## 2018-11-04 NOTE — Progress Notes (Signed)
I have read the note, and I agree with the clinical assessment and plan.  Glee Lashomb K Chrishonda Hesch   

## 2018-11-04 NOTE — Progress Notes (Signed)
PATIENT: Rebecca Alvarado DOB: Aug 11, 1965  REASON FOR VISIT: follow up HISTORY FROM: patient  Virtual Visit via Telephone Note  I connected with Rebecca Alvarado on 11/04/18 at  9:00 AM EDT by telephone and verified that I am speaking with the correct person using two identifiers.   I discussed the limitations, risks, security and privacy concerns of performing an evaluation and management service by telephone and the availability of in person appointments. I also discussed with the patient that there may be a patient responsible charge related to this service. The patient expressed understanding and agreed to proceed.   History of Present Illness:  11/04/18 Rebecca Alvarado is a 53 y.o. female for follow up.  Rebecca Alvarado reports that she is doing well.  She has weaned herself off of Zonegran.  She does continue Relpax for acute management of migraines.  She reports that she rarely had migraines until the last couple of weeks.  She has noted a mild increase in frequency.  She feels that she is fairly stable at this point.   [facility]  Rebecca Alvarado is a 53 year old female with a history of migraine headaches.  She returns today for follow-up.  She continues on Relpax.  At the last visit her Zonegran dose was reduced to 50 mg daily.  She reports that she never decreased her dose for fear that the headaches come back.  She continues on Zonegran 150 mg daily.  She states that her headaches typically start in the left side of the neck.  She does have photophobia and phonophobia as well as nausea and occasional vomiting.  She states on certain occasions she will have tingling in the left side of the face before the headache begins.  She reports that her headache typically occurs with changes in barometric pressure.  She returns today for evaluation.  HISTORY 08/22/16:  Rebecca Alvarado is a 53 year old right-handed white female with a history of migraine  headaches. The patient has done relatively well with her headaches since last seen. The patient has begun exercising, she has been able lose quite a bit of weight, and she has done better with the headaches having on average one a month or so. The patient claims that withRelpax, she can limit the headache within 4 or 5 hours. Sleep will help the headache. On occasion, she may get some left lower face and ear paresthesias unassociated with a visual disturbance, speech disturbance, or weakness. The paresthesias did not go into the body or arm or leg. The patient is interested in trying to lower her dose of the Zonegran. The patient does occasionally get nauseated, but she does not take medication for this. She returns for an evaluation. She finds that her daughter also has migraine headache.   Observations/Objective:  Generalized: Well developed, in no acute distress  Mentation: Alert oriented to time, place, history taking. Follows all commands speech and language fluent   Assessment and Plan:  53 y.o. year old female  has a past medical history of Migraine, Migraine without aura, without mention of intractable migraine without mention of status migrainosus (12/30/2013), and Renal calculi. with    ICD-10-CM   1. Migraine without aura and without status migrainosus, not intractable G43.009    Rebecca Alvarado is doing well in acute management with Relpax for migraines.  Although she has noted an increased frequency over the past couple weeks she is not concerned at this time.  She is  not interested in starting preventative medications.  She denies need for refill of Relpax at this time but will call when needed.  She was encouraged to ensure adequate hydration and a healthy diet.  She was instructed to contact our office should migraines continue to increase in frequency or severity.  We will follow-up with her in 1 year, sooner if needed.  She verbalizes understanding and agreement with this plan.   No  orders of the defined types were placed in this encounter.   No orders of the defined types were placed in this encounter.    Follow Up Instructions:  I discussed the assessment and treatment plan with the patient. The patient was provided an opportunity to ask questions and all were answered. The patient agreed with the plan and demonstrated an understanding of the instructions.   The patient was advised to call back or seek an in-person evaluation if the symptoms worsen or if the condition fails to improve as anticipated.  I provided 20 minutes of non-face-to-face time during this encounter.  Patient is located at her place of residence during virtual visit.  Provider is located at her place of residence.  Alverda Skeans, RN helped to facilitate visit.   Shawnie Dapper, NP

## 2018-11-20 ENCOUNTER — Telehealth: Payer: Self-pay

## 2018-11-20 NOTE — Telephone Encounter (Signed)
Unable to get in contact with the patient to schedule their yearly follow up with Amy. I left a voicemail asking the patient to return my call. Office number was provided.  If patient calls back please schedule their follow up appt.   

## 2019-10-31 ENCOUNTER — Other Ambulatory Visit: Payer: Self-pay | Admitting: Family Medicine

## 2019-10-31 DIAGNOSIS — R1011 Right upper quadrant pain: Secondary | ICD-10-CM

## 2019-11-03 ENCOUNTER — Other Ambulatory Visit: Payer: Self-pay

## 2019-11-03 ENCOUNTER — Ambulatory Visit: Payer: Commercial Managed Care - PPO | Admitting: Family Medicine

## 2019-11-03 ENCOUNTER — Encounter: Payer: Self-pay | Admitting: Family Medicine

## 2019-11-03 VITALS — BP 118/74 | HR 81 | Temp 97.1°F | Ht 68.0 in | Wt 210.0 lb

## 2019-11-03 DIAGNOSIS — G43009 Migraine without aura, not intractable, without status migrainosus: Secondary | ICD-10-CM | POA: Diagnosis not present

## 2019-11-03 MED ORDER — RELPAX 40 MG PO TABS: ORAL_TABLET | ORAL | 11 refills | Status: DC

## 2019-11-03 NOTE — Patient Instructions (Addendum)
We will continue Relpax as needed   Stay well hydrated. Eat regular meals. Continue close follow up with PCP and GYN   Follow up in 1 year, sooner if needed   Migraine Headache A migraine headache is a very strong throbbing pain on one side or both sides of your head. This type of headache can also cause other symptoms. It can last from 4 hours to 3 days. Talk with your doctor about what things may bring on (trigger) this condition. What are the causes? The exact cause of this condition is not known. This condition may be triggered or caused by:  Drinking alcohol.  Smoking.  Taking medicines, such as: ? Medicine used to treat chest pain (nitroglycerin). ? Birth control pills. ? Estrogen. ? Some blood pressure medicines.  Eating or drinking certain products.  Doing physical activity. Other things that may trigger a migraine headache include:  Having a menstrual period.  Pregnancy.  Hunger.  Stress.  Not getting enough sleep or getting too much sleep.  Weather changes.  Tiredness (fatigue). What increases the risk?  Being 79-74 years old.  Being female.  Having a family history of migraine headaches.  Being Caucasian.  Having depression or anxiety.  Being very overweight. What are the signs or symptoms?  A throbbing pain. This pain may: ? Happen in any area of the head, such as on one side or both sides. ? Make it hard to do daily activities. ? Get worse with physical activity. ? Get worse around bright lights or loud noises.  Other symptoms may include: ? Feeling sick to your stomach (nauseous). ? Vomiting. ? Dizziness. ? Being sensitive to bright lights, loud noises, or smells.  Before you get a migraine headache, you may get warning signs (an aura). An aura may include: ? Seeing flashing lights or having blind spots. ? Seeing bright spots, halos, or zigzag lines. ? Having tunnel vision or blurred vision. ? Having numbness or a tingling  feeling. ? Having trouble talking. ? Having weak muscles.  Some people have symptoms after a migraine headache (postdromal phase), such as: ? Tiredness. ? Trouble thinking (concentrating). How is this treated?  Taking medicines that: ? Relieve pain. ? Relieve the feeling of being sick to your stomach. ? Prevent migraine headaches.  Treatment may also include: ? Having acupuncture. ? Avoiding foods that bring on migraine headaches. ? Learning ways to control your body functions (biofeedback). ? Therapy to help you know and deal with negative thoughts (cognitive behavioral therapy). Follow these instructions at home: Medicines  Take over-the-counter and prescription medicines only as told by your doctor.  Ask your doctor if the medicine prescribed to you: ? Requires you to avoid driving or using heavy machinery. ? Can cause trouble pooping (constipation). You may need to take these steps to prevent or treat trouble pooping:  Drink enough fluid to keep your pee (urine) pale yellow.  Take over-the-counter or prescription medicines.  Eat foods that are high in fiber. These include beans, whole grains, and fresh fruits and vegetables.  Limit foods that are high in fat and sugar. These include fried or sweet foods. Lifestyle  Do not drink alcohol.  Do not use any products that contain nicotine or tobacco, such as cigarettes, e-cigarettes, and chewing tobacco. If you need help quitting, ask your doctor.  Get at least 8 hours of sleep every night.  Limit and deal with stress. General instructions      Keep a journal to find out  what may bring on your migraine headaches. For example, write down: ? What you eat and drink. ? How much sleep you get. ? Any change in what you eat or drink. ? Any change in your medicines.  If you have a migraine headache: ? Avoid things that make your symptoms worse, such as bright lights. ? It may help to lie down in a dark, quiet  room. ? Do not drive or use heavy machinery. ? Ask your doctor what activities are safe for you.  Keep all follow-up visits as told by your doctor. This is important. Contact a doctor if:  You get a migraine headache that is different or worse than others you have had.  You have more than 15 headache days in one month. Get help right away if:  Your migraine headache gets very bad.  Your migraine headache lasts longer than 72 hours.  You have a fever.  You have a stiff neck.  You have trouble seeing.  Your muscles feel weak or like you cannot control them.  You start to lose your balance a lot.  You start to have trouble walking.  You pass out (faint).  You have a seizure. Summary  A migraine headache is a very strong throbbing pain on one side or both sides of your head. These headaches can also cause other symptoms.  This condition may be treated with medicines and changes to your lifestyle.  Keep a journal to find out what may bring on your migraine headaches.  Contact a doctor if you get a migraine headache that is different or worse than others you have had.  Contact your doctor if you have more than 15 headache days in a month. This information is not intended to replace advice given to you by your health care provider. Make sure you discuss any questions you have with your health care provider. Document Revised: 10/11/2018 Document Reviewed: 08/01/2018 Elsevier Patient Education  2020 [employer].

## 2019-11-03 NOTE — Progress Notes (Signed)
PATIENT: Rebecca Alvarado DOB: December 07, 1965  REASON FOR VISIT: follow up HISTORY FROM: patient  Chief Complaint  Patient presents with  . Follow-up    migraine, alone, rm 9     HISTORY OF PRESENT ILLNESS: Today 11/03/19 Rebecca Alvarado is a 54 y.o. female here today for follow up for migraines. She continues magnesium daily and Relpax as needed. She may have 2-3 migraines per month easily aborted with Relpax. She has noticed a craving for salty foods with migraines. She is followed regularly by PCP. CPE performed last week and awaiting blood work. No history of anemia or hyponatremia. She continues to work as a Producer, television/film/video. She is feeling well today and without complaints.   HISTORY: (copied from my note on 11/04/2018)  Rebecca Alvarado is a 54 y.o. female for follow up.  Rebecca Alvarado reports that she is doing well.  She has weaned herself off of Zonegran.  She does continue Relpax for acute management of migraines.  She reports that she rarely had migraines until the last couple of weeks.  She has noted a mild increase in frequency.  She feels that she is fairly stable at this point.   HISTORY (copied from [facility] note on 08/28/2017)  Rebecca Alvarado a 54 year old female with a history of migraine headaches. She returns today for follow-up. She continues on Relpax. At the last visit her Zonegran dose was reduced to 50 mg daily. She reports that she never decreased her dose for fear that the headaches come back. She continues on Zonegran 150 mg daily. She states that her headaches typically start in the left side of the neck. She does have photophobia and phonophobia as well as nausea and occasional vomiting. She states on certain occasions she will have tingling in the left side of the face before the headache begins. She reports that her headache typically occurs with changes in barometric pressure. She returns today for evaluation.  HISTORY2/20/18:Rebecca Alvarado is a  54 year old right-handed white female with a history of migraine headaches. The patient has done relatively well with her headaches since last seen. The patient has begun exercising, she has been able lose quite a bit of weight, and she has done better with the headaches having on average one a month or so. The patient claims that withRelpax, she can limit the headache within 4 or 5 hours. Sleep will help the headache. On occasion, she may get some left lower face and ear paresthesias unassociated with a visual disturbance, speech disturbance, or weakness. The paresthesias did not go into the body or arm or leg. The patient is interested in trying to lower her dose of the Zonegran. The patient does occasionally get nauseated, but she does not take medication for this. She returns for an evaluation. She finds that her daughter also has migraine headache.   REVIEW OF SYSTEMS: Out of a complete 14 system review of symptoms, the patient complains only of the following symptoms, migraines and all other reviewed systems are negative.   ALLERGIES: Allergies  Allergen Reactions  . Topamax [Topiramate]     Parethesias, kidney stones    HOME MEDICATIONS: Outpatient Medications Prior to Visit  Medication Sig Dispense Refill  . ALPRAZolam (XANAX) 0.25 MG tablet     . budesonide-formoterol (SYMBICORT) 160-4.5 MCG/ACT inhaler     . Cyanocobalamin (B-12 PO) Take by mouth daily.    Marland Kitchen MAGNESIUM PO Take by mouth daily.    Marland Kitchen MINIVELLE 0.1 MG/24HR patch Place  1 patch onto the skin 2 (two) times a week.     . Omega-3 Fatty Acids (FISH OIL PO) Take by mouth daily.    . Probiotic Product (PROBIOTIC DAILY PO) Take by mouth daily.    Marland Kitchen VITAMIN D PO Take by mouth daily.    Marland Kitchen ZINC OXIDE PO Take 20 mg by mouth.    . RELPAX 40 MG tablet TAKE 1 TABLET BY MOUTH AT ONSET OF MIGRAINE. CAN REPEAT IN 2 HOURS IFNEEDED. NOT TO EXCEED 2 TABS IN 24 HOURS 30 tablet 3  . cyclobenzaprine (FLEXERIL) 10 MG tablet Take 1 tablet (10  mg total) by mouth at bedtime. (Patient not taking: Reported on 10/31/2018) 30 tablet 0   No facility-administered medications prior to visit.    PAST MEDICAL HISTORY: Past Medical History:  Diagnosis Date  . Migraine   . Migraine without aura, without mention of intractable migraine without mention of status migrainosus 12/30/2013  . Renal calculi     PAST SURGICAL HISTORY: Past Surgical History:  Procedure Laterality Date  . ELBOW SURGERY Right 08/2014  . PARTIAL HYSTERECTOMY    . scar tissue removal  1995   leg    FAMILY HISTORY: Family History  Problem Relation Age of Onset  . Neuropathy Father   . Migraines Mother   . Migraines Daughter     SOCIAL HISTORY: Social History   Socioeconomic History  . Marital status: Married    Spouse name: Hoy Morn   . Number of children: 2  . Years of education: college  . Highest education level: Not on file  Occupational History  . Occupation: hair stylist  Tobacco Use  . Smoking status: Never Smoker  . Smokeless tobacco: Never Used  Substance and Sexual Activity  . Alcohol use: Yes    Alcohol/week: 0.0 standard drinks    Comment: occasional  . Drug use: No  . Sexual activity: Not on file  Other Topics Concern  . Not on file  Social History Narrative   Patient lives at home with husband Hoy Morn.    Patient has 2 children.    Patient has a Trade school education.    Patient is right handed.    Social Determinants of Health   Financial Resource Strain:   . Difficulty of Paying Living Expenses:   Food Insecurity:   . Worried About Charity fundraiser in the Last Year:   . Arboriculturist in the Last Year:   Transportation Needs:   . Film/video editor (Medical):   Marland Kitchen Lack of Transportation (Non-Medical):   Physical Activity:   . Days of Exercise per Week:   . Minutes of Exercise per Session:   Stress:   . Feeling of Stress :   Social Connections:   . Frequency of Communication with Friends and Family:   .  Frequency of Social Gatherings with Friends and Family:   . Attends Religious Services:   . Active Member of Clubs or Organizations:   . Attends Archivist Meetings:   Marland Kitchen Marital Status:   Intimate Partner Violence:   . Fear of Current or Ex-Partner:   . Emotionally Abused:   Marland Kitchen Physically Abused:   . Sexually Abused:       PHYSICAL EXAM  Vitals:   11/03/19 0930  BP: 118/74  Pulse: 81  Temp: (!) 97.1 F (36.2 C)  Weight: 210 lb (95.3 kg)  Height: 5\' 8"  (1.727 m)   Body mass index is 31.93 kg/m.  Generalized: Well developed, in no acute distress  Cardiology: normal rate and rhythm, no murmur noted Respiratory: clear to auscultation bilaterally  Neurological examination  Mentation: Alert oriented to time, place, history taking. Follows all commands speech and language fluent Cranial nerve II-XII: Pupils were equal round reactive to light. Extraocular movements were full, visual field were full  Motor: The motor testing reveals 5 over 5 strength of all 4 extremities. Good symmetric motor tone is noted throughout.  Gait and station: Gait is normal.   DIAGNOSTIC DATA (LABS, IMAGING, TESTING) - I reviewed patient records, labs, notes, testing and imaging myself where available.  No flowsheet data found.   Lab Results  Component Value Date   WBC 9.3 08/26/2008   HGB 14.3 08/26/2008   HCT 42.0 08/26/2008   MCV 94.3 08/26/2008   PLT 285 08/26/2008      Component Value Date/Time   NA 142 08/26/2008 1838   K 3.6 08/26/2008 1838   CL 107 08/26/2008 1838   GLUCOSE 90 08/26/2008 1838   BUN 17 08/26/2008 1838   CREATININE 0.6 08/26/2008 1838   No results found for: CHOL, HDL, LDLCALC, LDLDIRECT, TRIG, CHOLHDL No results found for: UQJF3L No results found for: VITAMINB12 No results found for: TSH     ASSESSMENT AND PLAN 54 y.o. year old female  has a past medical history of Migraine, Migraine without aura, without mention of intractable migraine without  mention of status migrainosus (12/30/2013), and Renal calculi. here with     ICD-10-CM   1. Migraine without aura and without status migrainosus, not intractable  G43.009     Daisey is doing well today.  Migraines are well managed daily magnesium and as needed Relpax.  She is tolerating medications well with no obvious adverse effects.  We will continue current therapy.  She was encouraged to stay well-hydrated and continue to focus on healthy lifestyle habits.  It may be helpful to journal migraines to help identify triggers.  She will continue close follow-up with primary care.  She will follow-up with me in 1 year, sooner if needed.  She verbalizes understanding and agreement with this plan.   No orders of the defined types were placed in this encounter.    Meds ordered this encounter  Medications  . RELPAX 40 MG tablet    Sig: TAKE 1 TABLET BY MOUTH AT ONSET OF MIGRAINE. CAN REPEAT IN 2 HOURS IFNEEDED. NOT TO EXCEED 2 TABS IN 24 HOURS    Dispense:  10 tablet    Refill:  11    Order Specific Question:   Supervising Provider    Answer:   Anson Fret [[id number]]      I spent 15 minutes with the patient. 50% of this time was spent counseling and educating patient on plan of care and medications.    Shawnie Dapper, FNP-C 11/03/2019, 10:17 AM [facility] [address], [address] [place], [place] [address] [phone]

## 2019-11-04 ENCOUNTER — Ambulatory Visit
Admission: RE | Admit: 2019-11-04 | Discharge: 2019-11-04 | Disposition: A | Discharge status: Home / Self Care | Payer: Commercial Managed Care - PPO | Source: Ambulatory Visit | Attending: Family Medicine | Admitting: Family Medicine

## 2019-11-04 DIAGNOSIS — R1011 Right upper quadrant pain: Secondary | ICD-10-CM

## 2019-11-05 NOTE — Progress Notes (Signed)
I have read the note, and I agree with the clinical assessment and plan.  Zohaib Heeney K Montina Dorrance   

## 2019-11-13 ENCOUNTER — Other Ambulatory Visit: Payer: Self-pay | Admitting: *Deleted

## 2019-11-13 ENCOUNTER — Encounter: Payer: Self-pay | Admitting: Family Medicine

## 2020-01-20 ENCOUNTER — Other Ambulatory Visit: Payer: Self-pay | Admitting: Obstetrics & Gynecology

## 2020-01-20 DIAGNOSIS — Z1231 Encounter for screening mammogram for malignant neoplasm of breast: Secondary | ICD-10-CM

## 2020-01-28 ENCOUNTER — Ambulatory Visit
Admission: RE | Admit: 2020-01-28 | Discharge: 2020-01-28 | Disposition: A | Discharge status: Home / Self Care | Payer: Commercial Managed Care - PPO | Source: Ambulatory Visit | Attending: Obstetrics & Gynecology | Admitting: Obstetrics & Gynecology

## 2020-01-28 DIAGNOSIS — Z1231 Encounter for screening mammogram for malignant neoplasm of breast: Secondary | ICD-10-CM

## 2020-11-08 ENCOUNTER — Encounter: Payer: Self-pay | Admitting: Family Medicine

## 2020-11-08 ENCOUNTER — Ambulatory Visit: Payer: Commercial Managed Care - PPO | Admitting: Family Medicine

## 2020-11-08 VITALS — BP 114/71 | HR 85 | Ht 67.0 in | Wt 236.0 lb

## 2020-11-08 DIAGNOSIS — G43009 Migraine without aura, not intractable, without status migrainosus: Secondary | ICD-10-CM | POA: Diagnosis not present

## 2020-11-08 MED ORDER — AMITRIPTYLINE HCL 10 MG PO TABS: 10.0000 mg | ORAL_TABLET | Freq: Every day | ORAL | 3 refills | Status: DC

## 2020-11-08 NOTE — Progress Notes (Signed)
I have read the note, and I agree with the clinical assessment and plan.  Karmel Patricelli K Cadin Luka   

## 2020-11-08 NOTE — Patient Instructions (Addendum)
Below is our plan:  We will start amitriptyline 10mg  daily. May consider increasing dose pending tolerability. Let me know if you have any concerns. Continue magnesium and eletriptan. Consider benadryl for intractable headaches. You can take with eletriptan as needed.   Please make sure you are staying well hydrated. I recommend 50-60 ounces daily. Well balanced diet and regular exercise encouraged. Consistent sleep schedule with 6-8 hours recommended.   Please continue follow up with care team as directed.   Follow up with me in 6 months.   You may receive a survey regarding today's visit. I encourage you to leave honest feed back as I do use this information to improve patient care. Thank you for seeing me today!    Amitriptyline tablets What is this medicine? AMITRIPTYLINE (a mee TRIP ti leen) is used to treat depression. This medicine may be used for other purposes; ask your health care provider or pharmacist if you have questions. COMMON BRAND NAME(S): Elavil, Vanatrip What should I tell my health care provider before I take this medicine? They need to know if you have any of these conditions:  an alcohol problem  asthma, difficulty breathing  bipolar disorder or schizophrenia  difficulty passing urine, prostate trouble  glaucoma  heart disease or previous heart attack  liver disease  over active thyroid  seizures  thoughts or plans of suicide, a previous suicide attempt, or family history of suicide attempt  an unusual or allergic reaction to amitriptyline, other medicines, foods, dyes, or preservatives  pregnant or trying to get pregnant  breast-feeding How should I use this medicine? Take this medicine by mouth with a drink of water. Follow the directions on the prescription label. You can take the tablets with or without food. Take your medicine at regular intervals. Do not take it more often than directed. Do not stop taking this medicine suddenly except upon  the advice of your doctor. Stopping this medicine too quickly may cause serious side effects or your condition may worsen. A special MedGuide will be given to you by the pharmacist with each prescription and refill. Be sure to read this information carefully each time. Talk to your pediatrician regarding the use of this medicine in children. Special care may be needed. Overdosage: If you think you have taken too much of this medicine contact a poison control center or emergency room at once. NOTE: This medicine is only for you. Do not share this medicine with others. What if I miss a dose? If you miss a dose, take it as soon as you can. If it is almost time for your next dose, take only that dose. Do not take double or extra doses. What may interact with this medicine? Do not take this medicine with any of the following medications:  arsenic trioxide  certain medicines used to regulate abnormal heartbeat or to treat other heart conditions  cisapride  droperidol  halofantrine  linezolid  MAOIs like Carbex, Eldepryl, Marplan, Nardil, and Parnate  methylene blue  other medicines for mental depression  phenothiazines like perphenazine, thioridazine and chlorpromazine  pimozide  probucol  procarbazine  sparfloxacin  St. John's Wort This medicine may also interact with the following medications:  atropine and related drugs like hyoscyamine, scopolamine, tolterodine and others  barbiturate medicines for inducing sleep or treating seizures, like phenobarbital  cimetidine  disulfiram  ethchlorvynol  thyroid hormones such as levothyroxine  ziprasidone This list may not describe all possible interactions. Give your health care provider a list of  all the medicines, herbs, non-prescription drugs, or dietary supplements you use. Also tell them if you smoke, drink alcohol, or use illegal drugs. Some items may interact with your medicine. What should I watch for while using  this medicine? Tell your doctor if your symptoms do not get better or if they get worse. Visit your doctor or health care professional for regular checks on your progress. Because it may take several weeks to see the full effects of this medicine, it is important to continue your treatment as prescribed by your doctor. Patients and their families should watch out for new or worsening thoughts of suicide or depression. Also watch out for sudden changes in feelings such as feeling anxious, agitated, panicky, irritable, hostile, aggressive, impulsive, severely restless, overly excited and hyperactive, or not being able to sleep. If this happens, especially at the beginning of treatment or after a change in dose, call your health care professional. Bonita Quin may get drowsy or dizzy. Do not drive, use machinery, or do anything that needs mental alertness until you know how this medicine affects you. Do not stand or sit up quickly, especially if you are an older patient. This reduces the risk of dizzy or fainting spells. Alcohol may interfere with the effect of this medicine. Avoid alcoholic drinks. Do not treat yourself for coughs, colds, or allergies without asking your doctor or health care professional for advice. Some ingredients can increase possible side effects. Your mouth may get dry. Chewing sugarless gum or sucking hard candy, and drinking plenty of water will help. Contact your doctor if the problem does not go away or is severe. This medicine may cause dry eyes and blurred vision. If you wear contact lenses you may feel some discomfort. Lubricating drops may help. See your eye doctor if the problem does not go away or is severe. This medicine can cause constipation. Try to have a bowel movement at least every 2 to 3 days. If you do not have a bowel movement for 3 days, call your doctor or health care professional. This medicine can make you more sensitive to the sun. Keep out of the sun. If you cannot avoid  being in the sun, wear protective clothing and use sunscreen. Do not use sun lamps or tanning beds/booths. What side effects may I notice from receiving this medicine? Side effects that you should report to your doctor or health care professional as soon as possible:  allergic reactions like skin rash, itching or hives, swelling of the face, lips, or tongue  anxious  breathing problems  changes in vision  confusion  elevated mood, decreased need for sleep, racing thoughts, impulsive behavior  eye pain  fast, irregular heartbeat  feeling faint or lightheaded, falls  feeling agitated, angry, or irritable  fever with increased sweating  hallucination, loss of contact with reality  seizures  stiff muscles  suicidal thoughts or other mood changes  tingling, pain, or numbness in the feet or hands  trouble passing urine or change in the amount of urine  trouble sleeping  unusually weak or tired  vomiting  yellowing of the eyes or skin Side effects that usually do not require medical attention (report to your doctor or health care professional if they continue or are bothersome):  change in sex drive or performance  change in appetite or weight  constipation  dizziness  dry mouth  nausea  tired  tremors  upset stomach This list may not describe all possible side effects. Call your doctor  for medical advice about side effects. You may report side effects to FDA at 1-800-FDA-1088. Where should I keep my medicine? Keep out of the reach of children. Store at room temperature between 20 and 25 degrees C (68 and 77 degrees F). Throw away any unused medicine after the expiration date. NOTE: This sheet is a summary. It may not cover all possible information. If you have questions about this medicine, talk to your doctor, pharmacist, or health care provider.  2021 Elsevier/Gold Standard (2018-06-11 13:04:32)   Migraine Headache A migraine headache is a very  strong throbbing pain on one side or both sides of your head. This type of headache can also cause other symptoms. It can last from 4 hours to 3 days. Talk with your doctor about what things may bring on (trigger) this condition. What are the causes? The exact cause of this condition is not known. This condition may be triggered or caused by:  Drinking alcohol.  Smoking.  Taking medicines, such as: ? Medicine used to treat chest pain (nitroglycerin). ? Birth control pills. ? Estrogen. ? Some blood pressure medicines.  Eating or drinking certain products.  Doing physical activity. Other things that may trigger a migraine headache include:  Having a menstrual period.  Pregnancy.  Hunger.  Stress.  Not getting enough sleep or getting too much sleep.  Weather changes.  Tiredness (fatigue). What increases the risk?  Being 51-64 years old.  Being female.  Having a family history of migraine headaches.  Being Caucasian.  Having depression or anxiety.  Being very overweight. What are the signs or symptoms?  A throbbing pain. This pain may: ? Happen in any area of the head, such as on one side or both sides. ? Make it hard to do daily activities. ? Get worse with physical activity. ? Get worse around bright lights or loud noises.  Other symptoms may include: ? Feeling sick to your stomach (nauseous). ? Vomiting. ? Dizziness. ? Being sensitive to bright lights, loud noises, or smells.  Before you get a migraine headache, you may get warning signs (an aura). An aura may include: ? Seeing flashing lights or having blind spots. ? Seeing bright spots, halos, or zigzag lines. ? Having tunnel vision or blurred vision. ? Having numbness or a tingling feeling. ? Having trouble talking. ? Having weak muscles.  Some people have symptoms after a migraine headache (postdromal phase), such as: ? Tiredness. ? Trouble thinking (concentrating). How is this  treated?  Taking medicines that: ? Relieve pain. ? Relieve the feeling of being sick to your stomach. ? Prevent migraine headaches.  Treatment may also include: ? Having acupuncture. ? Avoiding foods that bring on migraine headaches. ? Learning ways to control your body functions (biofeedback). ? Therapy to help you know and deal with negative thoughts (cognitive behavioral therapy). Follow these instructions at home: Medicines  Take over-the-counter and prescription medicines only as told by your doctor.  Ask your doctor if the medicine prescribed to you: ? Requires you to avoid driving or using heavy machinery. ? Can cause trouble pooping (constipation). You may need to take these steps to prevent or treat trouble pooping:  Drink enough fluid to keep your pee (urine) pale yellow.  Take over-the-counter or prescription medicines.  Eat foods that are high in fiber. These include beans, whole grains, and fresh fruits and vegetables.  Limit foods that are high in fat and sugar. These include fried or sweet foods. Lifestyle  Do not drink  alcohol.  Do not use any products that contain nicotine or tobacco, such as cigarettes, e-cigarettes, and chewing tobacco. If you need help quitting, ask your doctor.  Get at least 8 hours of sleep every night.  Limit and deal with stress. General instructions  Keep a journal to find out what may bring on your migraine headaches. For example, write down: ? What you eat and drink. ? How much sleep you get. ? Any change in what you eat or drink. ? Any change in your medicines.  If you have a migraine headache: ? Avoid things that make your symptoms worse, such as bright lights. ? It may help to lie down in a dark, quiet room. ? Do not drive or use heavy machinery. ? Ask your doctor what activities are safe for you.  Keep all follow-up visits as told by your doctor. This is important.      Contact a doctor if:  You get a migraine  headache that is different or worse than others you have had.  You have more than 15 headache days in one month. Get help right away if:  Your migraine headache gets very bad.  Your migraine headache lasts longer than 72 hours.  You have a fever.  You have a stiff neck.  You have trouble seeing.  Your muscles feel weak or like you cannot control them.  You start to lose your balance a lot.  You start to have trouble walking.  You pass out (faint).  You have a seizure. Summary  A migraine headache is a very strong throbbing pain on one side or both sides of your head. These headaches can also cause other symptoms.  This condition may be treated with medicines and changes to your lifestyle.  Keep a journal to find out what may bring on your migraine headaches.  Contact a doctor if you get a migraine headache that is different or worse than others you have had.  Contact your doctor if you have more than 15 headache days in a month. This information is not intended to replace advice given to you by your health care provider. Make sure you discuss any questions you have with your health care provider. Document Revised: 10/11/2018 Document Reviewed: 08/01/2018 Elsevier Patient Education  2021 [employer]

## 2020-11-08 NOTE — Progress Notes (Addendum)
PATIENT: Rebecca Alvarado DOB: [birth year]  REASON FOR VISIT: follow up HISTORY FROM: patient  Chief Complaint  Patient presents with   Follow-up    RM 1, alone, 1 yr F/U for migraine- 3x weekly sometimes, taking up two tablets of Relpax.      HISTORY OF PRESENT ILLNESS: 11/08/20 ALL: Rebecca Alvarado returns for follow up for migraines. She continues magnesium and eletriptan. She was last seen 11/2019 and having 2-3 migraines per month. Over the past few months, headaches have increased to 2-3 per week. She endorses new stressors. Her daughter recently separated from her husband and has a small child. They are now living with [place]. She knows that weather changes are a trigger for her. Eletriptan usually works but occasionally needing to take two doses.   Patient has tried and failed: Preventative: Topiramate (kidney stones), zonisamide (concerned about memory loss), magnesium Abortive: rizatriptan, eletriptan, metoclopramide   11/03/2019 ALL:  Rebecca Alvarado is a 55 y.o. female here today for follow up for migraines. She continues magnesium daily and Relpax as needed. She may have 2-3 migraines per month easily aborted with Relpax. She has noticed a craving for salty foods with migraines. She is followed regularly by PCP. CPE performed last week and awaiting blood work. No history of anemia or hyponatremia. She continues to work as a Producer, television/film/video. She is feeling well today and without complaints.   HISTORY: (copied from my note on 11/04/2018)  Rebecca Alvarado is a 55 y.o. female for follow up.  Rebecca Alvarado reports that she is doing well.  She has weaned herself off of Zonegran.  She does continue Relpax for acute management of migraines.  She reports that she rarely had migraines until the last couple of weeks.  She has noted a mild increase in frequency.  She feels that she is fairly stable at this point.     HISTORY (copied from [facility] note on 08/28/2017)   Rebecca Alvarado is a 55 year old  female with a history of migraine headaches.  She returns today for follow-up.  She continues on Relpax.  At the last visit her Zonegran dose was reduced to 50 mg daily.  She reports that she never decreased her dose for fear that the headaches come back.  She continues on Zonegran 150 mg daily.  She states that her headaches typically start in the left side of the neck.  She does have photophobia and phonophobia as well as nausea and occasional vomiting.  She states on certain occasions she will have tingling in the left side of the face before the headache begins.  She reports that her headache typically occurs with changes in barometric pressure.  She returns today for evaluation.   HISTORY 08/22/16:  Rebecca Alvarado is a 55 year old right-handed white female with a history of migraine headaches. The patient has done relatively well with her headaches since last seen. The patient has begun exercising, she has been able lose quite a bit of weight, and she has done better with the headaches having on average one a month or so. The patient claims that with Relpax, she can limit the headache within 4 or 5 hours. Sleep will help the headache. On occasion, she may get some left lower face and ear paresthesias unassociated with a visual disturbance, speech disturbance, or weakness. The paresthesias did not go into the body or arm or leg. The patient is interested in trying to lower her dose of the Zonegran. The patient does  occasionally get nauseated, but she does not take medication for this. She returns for an evaluation. She finds that her daughter also has migraine headache.   REVIEW OF SYSTEMS: Out of a complete 14 system review of symptoms, the patient complains only of the following symptoms, migraines and all other reviewed systems are negative.   ALLERGIES: Allergies  Allergen Reactions   Topamax [Topiramate]     Parethesias, kidney stones    HOME MEDICATIONS: Outpatient Medications Prior to Visit   Medication Sig Dispense Refill   ALPRAZolam (XANAX) 0.25 MG tablet as needed.     budesonide-formoterol (SYMBICORT) 160-4.5 MCG/ACT inhaler as needed.     Cyanocobalamin (B-12 PO) Take by mouth daily.     MAGNESIUM PO Take by mouth daily.     MINIVELLE 0.1 MG/24HR patch Place 1 patch onto the skin 2 (two) times a week.      Omega-3 Fatty Acids (FISH OIL PO) Take by mouth daily.     Probiotic Product (PROBIOTIC DAILY PO) Take by mouth daily.     RELPAX 40 MG tablet TAKE 1 TABLET BY MOUTH AT ONSET OF MIGRAINE. CAN REPEAT IN 2 HOURS IFNEEDED. NOT TO EXCEED 2 TABS IN 24 HOURS 10 tablet 11   VITAMIN D PO Take by mouth daily.     ZINC OXIDE PO Take 20 mg by mouth.     No facility-administered medications prior to visit.    PAST MEDICAL HISTORY: Past Medical History:  Diagnosis Date   Migraine    Migraine without aura, without mention of intractable migraine without mention of status migrainosus 12/30/2013   Renal calculi     PAST SURGICAL HISTORY: Past Surgical History:  Procedure Laterality Date   ELBOW SURGERY Right 08/2014   PARTIAL HYSTERECTOMY     scar tissue removal  1995   leg    FAMILY HISTORY: Family History  Problem Relation Age of Onset   Neuropathy Father    Migraines Mother    Migraines Daughter     SOCIAL HISTORY: Social History   Socioeconomic History   Marital status: Married    Spouse name: Henreitta Cea    Number of children: 2   Years of education: college   Highest education level: Not on file  Occupational History   Occupation: hair stylist  Tobacco Use   Smoking status: Never Smoker   Smokeless tobacco: Never Used  Substance and Sexual Activity   Alcohol use: Yes    Alcohol/week: 0.0 standard drinks    Comment: occasional   Drug use: No   Sexual activity: Not on file  Other Topics Concern   Not on file  Social History Narrative   Patient lives at home with husband Henreitta Cea.    Patient has 2 children.    Patient has a Trade school education.     Patient is right handed.    Social Determinants of Health   Financial Resource Strain: Not on file  Food Insecurity: Not on file  Transportation Needs: Not on file  Physical Activity: Not on file  Stress: Not on file  Social Connections: Not on file  Intimate Partner Violence: Not on file      PHYSICAL EXAM  Vitals:   11/08/20 0917  BP: 114/71  Pulse: 85  Weight: 236 lb (107 kg)  Height: 5\' 7"  (1.702 m)   Body mass index is 36.96 kg/m.  Generalized: Well developed, in no acute distress  Cardiology: normal rate and rhythm, no murmur noted Respiratory: clear to auscultation bilaterally  Neurological examination  Mentation: Alert oriented to time, place, history taking. Follows all commands speech and language fluent Cranial nerve II-XII: Pupils were equal round reactive to light. Extraocular movements were full, visual field were full  Motor: The motor testing reveals 5 over 5 strength of all 4 extremities. Good symmetric motor tone is noted throughout.  Gait and station: Gait is normal.   DIAGNOSTIC DATA (LABS, IMAGING, TESTING) - I reviewed patient records, labs, notes, testing and imaging myself where available.  No flowsheet data found.   Lab Results  Component Value Date   WBC 9.3 08/26/2008   HGB 14.3 08/26/2008   HCT 42.0 08/26/2008   MCV 94.3 08/26/2008   PLT 285 08/26/2008      Component Value Date/Time   NA 142 08/26/2008 1838   K 3.6 08/26/2008 1838   CL 107 08/26/2008 1838   GLUCOSE 90 08/26/2008 1838   BUN 17 08/26/2008 1838   CREATININE 0.6 08/26/2008 1838   No results found for: CHOL, HDL, LDLCALC, LDLDIRECT, TRIG, CHOLHDL No results found for: EUMP5T No results found for: VITAMINB12 No results found for: TSH     ASSESSMENT AND PLAN 55 y.o. year old female  has a past medical history of Migraine, Migraine without aura, without mention of intractable migraine without mention of status migrainosus (12/30/2013), and Renal calculi. here with      ICD-10-CM   1. Migraine without aura and without status migrainosus, not intractable  G43.009      Shannie has noted increased frequency of migraines over the past few months. We will start amitriptyline 10mg  daily with plans to increase, as tolerated, to 25mg  daily. She may continue magnesium and eletriptan. She was encouraged to stay well-hydrated and continue to focus on healthy lifestyle habits.  It may be helpful to journal migraines to help identify triggers.  She will continue close follow-up with primary care.  She will follow-up with me in 6 months, sooner if needed.  She verbalizes understanding and agreement with this plan.   02/22/2021: Patient reports at least 16 headache days per month with nearly all described as migraines. Will submit to insurance for Botox approval.   No orders of the defined types were placed in this encounter.    Meds ordered this encounter  Medications   amitriptyline (ELAVIL) 10 MG tablet    Sig: Take 1 tablet (10 mg total) by mouth at bedtime.    Dispense:  90 tablet    Refill:  3    Order Specific Question:   Supervising Provider    Answer:   , FNP-C 11/08/2020, 9:52 AM [facility] [address] [place], [address] [place] [phone]

## 2020-11-27 ENCOUNTER — Encounter (HOSPITAL_COMMUNITY): Payer: Self-pay

## 2020-11-27 ENCOUNTER — Other Ambulatory Visit: Payer: Self-pay

## 2020-11-27 ENCOUNTER — Emergency Department (HOSPITAL_COMMUNITY)
Admission: EM | Admit: 2020-11-27 | Discharge: 2020-11-28 | Disposition: A | Discharge status: Home / Self Care | Payer: Commercial Managed Care - PPO | Attending: Emergency Medicine | Admitting: Emergency Medicine

## 2020-11-27 ENCOUNTER — Emergency Department (HOSPITAL_COMMUNITY): Payer: Commercial Managed Care - PPO

## 2020-11-27 DIAGNOSIS — W19XXXA Unspecified fall, initial encounter: Secondary | ICD-10-CM | POA: Insufficient documentation

## 2020-11-27 DIAGNOSIS — S4992XA Unspecified injury of left shoulder and upper arm, initial encounter: Secondary | ICD-10-CM | POA: Diagnosis present

## 2020-11-27 DIAGNOSIS — S0083XA Contusion of other part of head, initial encounter: Secondary | ICD-10-CM

## 2020-11-27 DIAGNOSIS — S42292A Other displaced fracture of upper end of left humerus, initial encounter for closed fracture: Secondary | ICD-10-CM

## 2020-11-27 NOTE — ED Triage Notes (Signed)
Patient reports she tripped over her grandson;s walker and fell on her L upper arm, states she hit her head, unknown LOC.

## 2020-11-28 MED ORDER — ACETAMINOPHEN 500 MG PO TABS
1000.0000 mg | ORAL_TABLET | Freq: Once | ORAL | Status: AC
  Administered 2020-11-28: 1000 mg via ORAL
  Filled 2020-11-28: qty 2

## 2020-11-28 MED ORDER — OXYCODONE HCL 5 MG PO TABS
5.0000 mg | ORAL_TABLET | Freq: Once | ORAL | Status: AC
  Administered 2020-11-28: 5 mg via ORAL
  Filled 2020-11-28: qty 1

## 2020-11-28 MED ORDER — OXYCODONE HCL 5 MG PO TABS: 2.5000 mg | ORAL_TABLET | Freq: Four times a day (QID) | ORAL | 0 refills | Status: DC | PRN

## 2020-11-28 NOTE — ED Provider Notes (Signed)
[facility] EMERGENCY DEPARTMENT Provider Note  CSN: [id number] Arrival date & time: 11/27/20 1949  Chief Complaint(s) Fall and Arm Injury  HPI Rebecca Alvarado is a 55 y.o. female patient presents with left shoulder pain after mechanical fall at home several hours prior to arrival.  Patient tripped over a kids walker.  She tried to avoid hitting the child causing her to fall onto her left shoulder.  She felt immediate pain there.  Pain is severe aching/throbbing worse with movement and palpation.  Alleviated by mobility.  She endorsed head trauma.  No loss of consciousness but patient did feel a bit out of it after the fall.  She denies any headache or neck pain.  No back pain.  No chest pain.  No abdominal pain.  No hip pain or other extremity pain.  Patient is not anticoagulated.  HPI  Past Medical History Past Medical History:  Diagnosis Date  . Migraine   . Migraine without aura, without mention of intractable migraine without mention of status migrainosus 12/30/2013  . Renal calculi    Patient Active Problem List   Diagnosis Date Noted  . Migraine   . Migraine without aura 12/30/2013   Home Medication(s) Prior to Admission medications   Medication Sig Start Date End Date Taking? Authorizing Provider  oxyCODONE (ROXICODONE) 5 MG immediate release tablet Take 0.5-1 tablets (2.5-5 mg total) by mouth every 6 (six) hours as needed for up to 5 days for severe pain. 11/28/20 12/03/20 Yes Deondray Ospina, Amadeo Garnet, MD  ALPRAZolam Prudy Feeler) 0.25 MG tablet as needed. 10/08/18   [provider]  amitriptyline (ELAVIL) 10 MG tablet Take 1 tablet (10 mg total) by mouth at bedtime. 11/08/20   Lomax, Amy, NP  budesonide-formoterol (SYMBICORT) 160-4.5 MCG/ACT inhaler as needed. 10/08/18   [provider]  Cyanocobalamin (B-12 PO) Take by mouth daily.    [provider]  MAGNESIUM PO Take by mouth daily.    [provider]  MINIVELLE 0.1 MG/24HR patch Place  1 patch onto the skin 2 (two) times a week.  08/03/17   [provider]  Omega-3 Fatty Acids (FISH OIL PO) Take by mouth daily.    [provider]  Probiotic Product (PROBIOTIC DAILY PO) Take by mouth daily.    [provider]  RELPAX 40 MG tablet TAKE 1 TABLET BY MOUTH AT ONSET OF MIGRAINE. CAN REPEAT IN 2 HOURS IFNEEDED. NOT TO EXCEED 2 TABS IN 24 HOURS 11/03/19   Lomax, Amy, NP  VITAMIN D PO Take by mouth daily.    [provider]  ZINC OXIDE PO Take 20 mg by mouth.    [provider]                                                                                                                                    Past Surgical History Past Surgical History:  Procedure Laterality Date  .  ELBOW SURGERY Right 08/2014  . PARTIAL HYSTERECTOMY    . scar tissue removal  1995   leg   Family History Family History  Problem Relation Age of Onset  . Neuropathy Father   . Migraines Mother   . Migraines Daughter     Social History Social History   Tobacco Use  . Smoking status: Never Smoker  . Smokeless tobacco: Never Used  Substance Use Topics  . Alcohol use: Yes    Alcohol/week: 0.0 standard drinks    Comment: occasional  . Drug use: No   Allergies Topamax [topiramate]  Review of Systems Review of Systems All other systems are reviewed and are negative for acute change except as noted in the HPI  Physical Exam Vital Signs  I have reviewed the triage vital signs BP (!) 145/77 (BP Location: Right Arm)   Pulse (!) 102   Temp 98.6 F (37 C) (Oral)   Resp 18   Ht 5\' 8"  (1.727 m)   Wt 106.1 kg   SpO2 100%   BMI 35.58 kg/m   Physical Exam Constitutional:      General: She is not in acute distress.    Appearance: She is well-developed. She is not diaphoretic.  HENT:     Head: Normocephalic and atraumatic.     Right Ear: External ear normal.     Left Ear: External ear normal.     Nose: Nose normal.  Eyes:     General: No scleral  icterus.       Right eye: No discharge.        Left eye: No discharge.     Conjunctiva/sclera: Conjunctivae normal.     Pupils: Pupils are equal, round, and reactive to light.  Cardiovascular:     Rate and Rhythm: Normal rate and regular rhythm.     Pulses:          Radial pulses are 2+ on the right side and 2+ on the left side.       Dorsalis pedis pulses are 2+ on the right side and 2+ on the left side.     Heart sounds: Normal heart sounds. No murmur heard. No friction rub. No gallop.   Pulmonary:     Effort: Pulmonary effort is normal. No respiratory distress.     Breath sounds: Normal breath sounds. No stridor. No wheezing.  Abdominal:     General: There is no distension.     Palpations: Abdomen is soft.     Tenderness: There is no abdominal tenderness.  Musculoskeletal:     Left shoulder: Swelling, tenderness and bony tenderness present. Decreased range of motion. Normal strength. Normal pulse.     Cervical back: Normal range of motion and neck supple. No bony tenderness. No spinous process tenderness or muscular tenderness.     Thoracic back: No bony tenderness.     Lumbar back: No bony tenderness.     Comments: Clavicles stable. Chest stable to AP/Lat compression. Pelvis stable to Lat compression. No obvious extremity deformity. No chest or abdominal wall contusion.  Skin:    General: Skin is warm and dry.     Findings: No erythema or rash.  Neurological:     Mental Status: She is alert and oriented to person, place, and time.     Comments: Moving all extremities     ED Results and Treatments Labs (all labs ordered are listed, but only abnormal results are displayed) Labs Reviewed - No data to display  EKG  EKG Interpretation  Date/Time:    Ventricular Rate:    PR Interval:    QRS Duration:   QT Interval:    QTC Calculation:   R Axis:      Text Interpretation:        Radiology DG Humerus Left  Result Date: 11/27/2020 CLINICAL DATA:  Recent fall with left arm pain, initial encounter EXAM: LEFT HUMERUS - 2+ VIEW COMPARISON:  None. FINDINGS: Comminuted proximal left humeral fracture is noted involving the surgical neck and extending into the greater tuberosity. No significant impaction is noted. No other focal abnormality is noted. IMPRESSION: Comminuted proximal left humeral fracture as described. Electronically Signed   By: Alcide Clever M.D.   On: 11/27/2020 20:11    Pertinent labs & imaging results that were available during my care of the patient were reviewed by me and considered in my medical decision making (see chart for details).  Medications Ordered in ED Medications  acetaminophen (TYLENOL) tablet 1,000 mg (has no administration in time range)  oxyCODONE (Oxy IR/ROXICODONE) immediate release tablet 5 mg (has no administration in time range)                                                                                                                                    Procedures Procedures  (including critical care time)  Medical Decision Making / ED Course I have reviewed the nursing notes for this encounter and the patient's prior records (if available in EHR or on provided paperwork).   HUDA PETREY was evaluated in Emergency Department on 11/28/2020 for the symptoms described in the history of present illness. She was evaluated in the context of the global COVID-19 pandemic, which necessitated consideration that the patient might be at risk for infection with the SARS-CoV-2 virus that causes COVID-19. Institutional protocols and algorithms that pertain to the evaluation of patients at risk for COVID-19 are in a state of rapid change based on information released by regulatory bodies including the CDC and federal and state organizations. These policies and algorithms were followed during the patient's care in  the ED.  Mechanical fall resulting in left proximal humeral fracture. Neurovascular intact distally. Patient with left forehead contusion. Denies any headache or neck pain and she is not anticoagulated. Low suspicion for ICH or cervical fracture.. No other injuries noted on exam requiring imaging at this time.. Patient provided with sling and oral pain medicine.. Patient already established with Dr. Magnus Ivan from orthopedic surgery. Recommended follow-up with him for definitive management.      Final Clinical Impression(s) / ED Diagnoses Final diagnoses:  Other closed displaced fracture of proximal end of left humerus, initial encounter  Contusion of forehead, initial encounter   The patient appears reasonably screened and/or stabilized for discharge and I doubt any other medical condition or other [facility] requiring further screening, evaluation, or treatment in the ED at this time prior to discharge.  Safe for discharge with strict return precautions.  Disposition: Discharge  Condition: Good  I have discussed the results, Dx and Tx plan with the patient/family who expressed understanding and agree(s) with the plan. Discharge instructions discussed at length. The patient/family was given strict return precautions who verbalized understanding of the instructions. No further questions at time of discharge.    ED Discharge Orders         Ordered    oxyCODONE (ROXICODONE) 5 MG immediate release tablet  Every 6 hours PRN        11/28/20 0023            Follow Up: Dois Davenportichter, Karen L, MD [address] [place] [address] [phone]  Call  as needed for additional pain control  Kathryne HitchBlackman, Christopher Y, MD [address] [place] [address] [phone]  Call  to schedule an appointment for close follow up      This chart was dictated using voice recognition software.  Despite best efforts to proofread,  errors can occur which can change the  documentation meaning.   Nira Connardama, Elvyn Krohn Eduardo, MD 11/28/20 [phone]

## 2020-11-28 NOTE — ED Notes (Signed)
Ortho paged. 

## 2020-11-28 NOTE — Discharge Instructions (Signed)
For pain control you may take at 1000 mg of Tylenol every 8 hours scheduled.  In addition you can take 0.5 to 1 tablet of Oxycodone every 6 hours as needed for pain not controlled with the scheduled Tylenol. ? ?

## 2020-11-30 ENCOUNTER — Other Ambulatory Visit: Payer: Self-pay

## 2020-11-30 ENCOUNTER — Ambulatory Visit: Payer: Commercial Managed Care - PPO | Admitting: Orthopaedic Surgery

## 2020-11-30 ENCOUNTER — Ambulatory Visit: Payer: Self-pay

## 2020-11-30 ENCOUNTER — Encounter: Payer: Self-pay | Admitting: Orthopaedic Surgery

## 2020-11-30 DIAGNOSIS — S42292A Other displaced fracture of upper end of left humerus, initial encounter for closed fracture: Secondary | ICD-10-CM

## 2020-11-30 DIAGNOSIS — G8929 Other chronic pain: Secondary | ICD-10-CM | POA: Diagnosis not present

## 2020-11-30 DIAGNOSIS — M25512 Pain in left shoulder: Secondary | ICD-10-CM | POA: Diagnosis not present

## 2020-11-30 MED ORDER — METHOCARBAMOL 500 MG PO TABS: 500.0000 mg | ORAL_TABLET | Freq: Four times a day (QID) | ORAL | 1 refills | Status: DC | PRN

## 2020-11-30 MED ORDER — OXYCODONE HCL 5 MG PO TABS: 5.0000 mg | ORAL_TABLET | Freq: Four times a day (QID) | ORAL | 0 refills | Status: AC | PRN

## 2020-11-30 NOTE — Progress Notes (Signed)
Office Visit Note   Patient: Rebecca Alvarado           Date of Birth: 1966-04-29           MRN: [id number] Visit Date: 11/30/2020              Requested by: Dois Davenport, MD [address] [place],  [place] [address] PCP: Dois Davenport, MD   Assessment & Plan: Visit Diagnoses:  1. Chronic left shoulder pain   2. Closed 4-part fracture of proximal humerus, left, initial encounter     Plan: I did share with the patient her shoulder model and her x-rays.  We need to obtain a CT scan of her left shoulder given the comminuted nature of the fracture to better ascertain treatment for this fracture.  We will work on getting the CT scan obtained and then go from there in terms of what recommendation of open reduction/internal fixation versus even considering a shoulder arthroplasty.  We will not know that until the CT scan is obtained of the left shoulder which I feel is necessary for surgical planning.  All questions and concerns were answered and addressed.  I have recommended 6 to 800 mg of Advil 2-3 times a day with meals and I will send in some more oxycodone and a muscle relaxant.  Follow-Up Instructions: Return in about 1 week (around 12/07/2020).   Orders:  Orders Placed This Encounter  Procedures  . XR Shoulder Left   Meds ordered this encounter  Medications  . oxyCODONE (ROXICODONE) 5 MG immediate release tablet    Sig: Take 1-2 tablets (5-10 mg total) by mouth every 6 (six) hours as needed for up to 5 days for severe pain.    Dispense:  30 tablet    Refill:  0  . methocarbamol (ROBAXIN) 500 MG tablet    Sig: Take 1 tablet (500 mg total) by mouth every 6 (six) hours as needed.    Dispense:  40 tablet    Refill:  1      Procedures: No procedures performed   Clinical Data: No additional findings.   Subjective: Chief Complaint  Patient presents with  . Left Shoulder - Fracture  The patient is a 55 year old female that I seen in the past.  She comes in  with an acute left shoulder injury.  She sustained a mechanical fall on Saturday injuring her left nondominant shoulder.  She is in a sling after being seen in the emergency room and being found to have a proximal humerus fracture of the left shoulder.  She denies any other injuries.  She denies any numbness and tingling in her left hand.  She does work as a Interior and spatial designer.  She is on oxycodone for pain and its not helping as much as she would like.  She is not a diabetic and not on any blood thinning medications.  She is not a smoker.  HPI  Review of Systems There is currently listed no headache, chest pain, shortness of breath, fever, chills, nausea, vomiting  Objective: Vital Signs: There were no vitals taken for this visit.  Physical Exam She is alert and orient x3 and in no acute distress Ortho Exam Clinically her left shoulder is located but is very painful and there is bruising.  Her elbow and wrist exam as well as hand exam on the left side are normal. Specialty Comments:  No specialty comments available.  Imaging: XR Shoulder Left  Result Date:  11/30/2020 An AP view of the left shoulder shows a comminuted proximal humerus fracture.    PMFS History: Patient Active Problem List   Diagnosis Date Noted  . Migraine   . Migraine without aura 12/30/2013   Past Medical History:  Diagnosis Date  . Migraine   . Migraine without aura, without mention of intractable migraine without mention of status migrainosus 12/30/2013  . Renal calculi     Family History  Problem Relation Age of Onset  . Neuropathy Father   . Migraines Mother   . Migraines Daughter     Past Surgical History:  Procedure Laterality Date  . ELBOW SURGERY Right 08/2014  . PARTIAL HYSTERECTOMY    . scar tissue removal  1995   leg   Social History   Occupational History  . Occupation: hair stylist  Tobacco Use  . Smoking status: Never Smoker  . Smokeless tobacco: Never Used  Substance and Sexual Activity   . Alcohol use: Yes    Alcohol/week: 0.0 standard drinks    Comment: occasional  . Drug use: No  . Sexual activity: Not on file

## 2020-12-03 ENCOUNTER — Ambulatory Visit
Admission: RE | Admit: 2020-12-03 | Discharge: 2020-12-03 | Disposition: A | Discharge status: Home / Self Care | Payer: Commercial Managed Care - PPO | Source: Ambulatory Visit | Attending: Orthopaedic Surgery | Admitting: Orthopaedic Surgery

## 2020-12-03 DIAGNOSIS — S42292A Other displaced fracture of upper end of left humerus, initial encounter for closed fracture: Secondary | ICD-10-CM

## 2020-12-03 NOTE — Progress Notes (Signed)
Hi Rebecca Alvarado.  I looked at her scan.  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  I am planning to see her  on Monday for possible surgery on Tuesday.  Thank you

## 2020-12-06 ENCOUNTER — Ambulatory Visit: Payer: Commercial Managed Care - PPO | Admitting: Orthopedic Surgery

## 2020-12-06 ENCOUNTER — Encounter: Payer: Self-pay | Admitting: Orthopedic Surgery

## 2020-12-06 DIAGNOSIS — S42292A Other displaced fracture of upper end of left humerus, initial encounter for closed fracture: Secondary | ICD-10-CM

## 2020-12-07 ENCOUNTER — Ambulatory Visit: Payer: Commercial Managed Care - PPO | Admitting: Orthopaedic Surgery

## 2020-12-10 ENCOUNTER — Encounter: Payer: Self-pay | Admitting: Orthopedic Surgery

## 2020-12-10 NOTE — Progress Notes (Signed)
Office Visit Note   Patient: Rebecca Alvarado           Date of Birth: 02-Apr-1966           MRN: [id number] Visit Date: 12/06/2020 Requested by: Dois Davenport, MD 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  She is 9 days out from her injury.  She is not a smoker.  She has a history of migraines.  She has been wearing a sling.  Overall her pain is improving to some degree.  Taking hydrocodone and Robaxin.  She has had a CT scan which is reviewed.  In general she has part of her greater tuberosity attached to the infraspinatus which is about at the level of the top of the humeral head.  Otherwise the fracture position is in reasonable alignment in terms of not too much varus and nothing encroaching in the subacromial space.              ROS: All systems reviewed are negative as they relate to the chief complaint within the history of present illness.  Patient denies  fevers or chills.   Assessment & Plan: Visit Diagnoses:  1. Closed 4-part fracture of proximal humerus, left, initial encounter     Plan: Impression is left shoulder proximal humerus fracture.  This fracture is in the gray zone in terms of fixation.  We discussed the risk of avascular necrosis which could already be in the works based on the fracture pattern or it could be encouraged with operative fixation of the fracture.  We reviewed the scan together.  Overall the shoulder is located.  The only part of this that I have some concern about is that infraspinatus tuberosity fragment which if it migrates further superiorly could be problematic with AB duction although it is in the posterior aspect of the humeral head.  Overall Delisha is more reluctant than not to undergo surgical intervention.  I think it is  possible she could have reasonable function of the shoulder with nonoperative treatment and rehabilitation.  Operative treatment would allow for a quicker recovery but still would not be an easy road.  Kathlen would like to hold off on definitive treatment in terms of operative fixation for a week which I think is reasonable although it is getting close to the time where fracture fragments manipulation will be more difficult.  She understands the risk and benefits of the current treatment strategy and we will see her back with repeat radiographs in 7 days.  Follow-Up Instructions: Return in about 1 week (around 12/13/2020).   Orders:  No orders of the defined types were placed in this encounter.  No orders of the defined types were placed in this encounter.     Procedures: No procedures performed   Clinical Data: No additional findings.  Objective: Vital Signs: There were no vitals taken for this visit.  Physical Exam:   Constitutional: Patient appears well-developed HEENT:  Head: Normocephalic Eyes:EOM are normal Neck: Normal range of motion Cardiovascular: Normal rate Pulmonary/chest: Effort normal Neurologic: Patient is alert Skin: Skin is warm Psychiatric: Patient has normal mood and affect   Ortho Exam: Ortho exam demonstrates some swelling in the left shoulder region.  Radial pulses intact.  Deltoid does fire.  Wrist and  elbow nontender.  Not too much more manipulation was done on the shoulder.  Specialty Comments:  No specialty comments available.  Imaging: No results found.   PMFS History: Patient Active Problem List   Diagnosis Date Noted   Migraine    Migraine without aura 12/30/2013   Past Medical History:  Diagnosis Date   Migraine    Migraine without aura, without mention of intractable migraine without mention of status migrainosus 12/30/2013   Renal calculi     Family History  Problem Relation Age of Onset   Neuropathy Father    Migraines Mother     Migraines Daughter     Past Surgical History:  Procedure Laterality Date   ELBOW SURGERY Right 08/2014   PARTIAL HYSTERECTOMY     scar tissue removal  1995   leg   Social History   Occupational History   Occupation: hair stylist  Tobacco Use   Smoking status: Never   Smokeless tobacco: Never  Substance and Sexual Activity   Alcohol use: Yes    Alcohol/week: 0.0 standard drinks    Comment: occasional   Drug use: No   Sexual activity: Not on file

## 2020-12-13 ENCOUNTER — Ambulatory Visit: Payer: Self-pay

## 2020-12-13 ENCOUNTER — Ambulatory Visit: Payer: Commercial Managed Care - PPO | Admitting: Orthopedic Surgery

## 2020-12-13 ENCOUNTER — Encounter: Payer: Self-pay | Admitting: Orthopedic Surgery

## 2020-12-13 ENCOUNTER — Other Ambulatory Visit: Payer: Self-pay

## 2020-12-13 DIAGNOSIS — S42292A Other displaced fracture of upper end of left humerus, initial encounter for closed fracture: Secondary | ICD-10-CM

## 2020-12-16 ENCOUNTER — Encounter: Payer: Self-pay | Admitting: Orthopedic Surgery

## 2020-12-16 NOTE — Progress Notes (Signed)
   Post-Op Visit Note   Patient: Rebecca Alvarado           Date of Birth: Jan 29, 1966           MRN: [id number] Visit Date: 12/13/2020 PCP: Dois Davenport, MD   Assessment & Plan:  Chief Complaint:  Chief Complaint  Patient presents with   Left Shoulder - Pain   Visit Diagnoses:  1. Closed 4-part fracture of proximal humerus, left, initial encounter     Plan: Rebecca Alvarado is a 55 year old patient who is now about 2 and half weeks out left proximal humerus fracture.  Taking Advil oxycodone.  On exam the fracture is moving as a unit.  Radiographs show no change in fracture position.  Overall the fracture alignment is fairly reasonable for healing.  Based on our interaction the last clinic visit the plan at this time is for continued observation for another week.  Not really enough migration of that tuberosity fragment into the subacromial space to warrant surgical intervention at this time.  The head has not collapsed into excessive varus.  Plan is repeat radiographs in 7 days with initiation of pendulum exercises at that time.  Follow-Up Instructions: Return in about 1 week (around 12/20/2020).   Orders:  Orders Placed This Encounter  Procedures   XR Shoulder Left   No orders of the defined types were placed in this encounter.   Imaging: No results found.  PMFS History: Patient Active Problem List   Diagnosis Date Noted   Migraine    Migraine without aura 12/30/2013   Past Medical History:  Diagnosis Date   Migraine    Migraine without aura, without mention of intractable migraine without mention of status migrainosus 12/30/2013   Renal calculi     Family History  Problem Relation Age of Onset   Neuropathy Father    Migraines Mother    Migraines Daughter     Past Surgical History:  Procedure Laterality Date   ELBOW SURGERY Right 08/2014   PARTIAL HYSTERECTOMY     scar tissue removal  1995   leg   Social History   Occupational History   Occupation: hair stylist   Tobacco Use   Smoking status: Never   Smokeless tobacco: Never  Substance and Sexual Activity   Alcohol use: Yes    Alcohol/week: 0.0 standard drinks    Comment: occasional   Drug use: No   Sexual activity: Not on file

## 2020-12-22 ENCOUNTER — Encounter: Payer: Self-pay | Admitting: Orthopedic Surgery

## 2020-12-22 ENCOUNTER — Ambulatory Visit: Payer: Commercial Managed Care - PPO | Admitting: Orthopedic Surgery

## 2020-12-22 ENCOUNTER — Ambulatory Visit (INDEPENDENT_AMBULATORY_CARE_PROVIDER_SITE_OTHER): Payer: Commercial Managed Care - PPO

## 2020-12-22 DIAGNOSIS — S42292A Other displaced fracture of upper end of left humerus, initial encounter for closed fracture: Secondary | ICD-10-CM | POA: Diagnosis not present

## 2020-12-22 NOTE — Progress Notes (Signed)
   Post-Op Visit Note   Patient: Rebecca Alvarado           Date of Birth: [birth year]           MRN: [id number] Visit Date: 12/22/2020 PCP: Dois Davenport, MD   Assessment & Plan:  Chief Complaint:  Chief Complaint  Patient presents with   Other    F/u fx   Visit Diagnoses:  1. Closed 4-part fracture of proximal humerus, left, initial encounter     Plan: Murel is a 55 year old patient is now about 3 weeks out left proximal humerus fracture.  On exam the fracture is moving as a unit.  Radiographs show no change in fracture alignment.  I will have her start some clockwise and counterclockwise pendulum exercises 6 times a day 30 rotations each for the next 2-1/2 weeks.  I will see her back at that time with repeat radiographs and initiation of physical therapy and discontinuation of the sling.  Follow-Up Instructions: No follow-ups on file.   Orders:  Orders Placed This Encounter  Procedures   XR Shoulder Left   No orders of the defined types were placed in this encounter.   Imaging: XR Shoulder Left  Result Date: 12/22/2020 2 views left shoulder radiographs reviewed.  Proximal humerus fracture unchanged in alignment or position.  Greater tuberosity fragments have not migrated above the humeral head.  shaft head alignment appears unchanged.   PMFS History: Patient Active Problem List   Diagnosis Date Noted   Migraine    Migraine without aura 12/30/2013   Past Medical History:  Diagnosis Date   Migraine    Migraine without aura, without mention of intractable migraine without mention of status migrainosus 12/30/2013   Renal calculi     Family History  Problem Relation Age of Onset   Neuropathy Father    Migraines Mother    Migraines Daughter     Past Surgical History:  Procedure Laterality Date   ELBOW SURGERY Right 08/2014   PARTIAL HYSTERECTOMY     scar tissue removal  1995   leg   Social History   Occupational History   Occupation: hair stylist  Tobacco  Use   Smoking status: Never   Smokeless tobacco: Never  Substance and Sexual Activity   Alcohol use: Yes    Alcohol/week: 0.0 standard drinks    Comment: occasional   Drug use: No   Sexual activity: Not on file

## 2021-01-06 ENCOUNTER — Ambulatory Visit (INDEPENDENT_AMBULATORY_CARE_PROVIDER_SITE_OTHER): Payer: Commercial Managed Care - PPO

## 2021-01-06 ENCOUNTER — Ambulatory Visit: Payer: Commercial Managed Care - PPO | Admitting: Orthopedic Surgery

## 2021-01-06 ENCOUNTER — Encounter: Payer: Self-pay | Admitting: Orthopedic Surgery

## 2021-01-06 DIAGNOSIS — S42292A Other displaced fracture of upper end of left humerus, initial encounter for closed fracture: Secondary | ICD-10-CM | POA: Diagnosis not present

## 2021-01-06 NOTE — Progress Notes (Signed)
   Post-Op Visit Note   Patient: Rebecca Alvarado           Date of Birth: Dec 28, 1965           MRN: [id number] Visit Date: 01/06/2021 PCP: Rebecca Davenport, MD   Assessment & Plan:  Chief Complaint:  Chief Complaint  Patient presents with   Left Shoulder - Follow-up   Visit Diagnoses:  1. Closed 4-part fracture of proximal humerus, left, initial encounter     Plan: Rebecca Alvarado is a 55 year old patient with left proximal humerus fracture.  She is 6 weeks out tomorrow.  She is doing better on a daily basis.  On exam the fracture moves as a unit.  She has about 20 degrees of external rotation 40 of forward flexion passively and 40 of extension passively as well as 40 of abduction passively.  Still predictably weak with internal and external rotation.  Radiographs look good.  I Ernie Hew have her start doing some passive range of motion exercises with a door pulley also start therapy with Rebecca Alvarado at [facility] physical therapy.  New sling provided today.  The wound is wearing out.  Come back in 4 weeks for clinical recheck and repeat radiographs.  Follow-Up Instructions: Return in about 4 weeks (around 02/03/2021).   Orders:  Orders Placed This Encounter  Procedures   XR Shoulder Left   No orders of the defined types were placed in this encounter.   Imaging: XR Shoulder Left  Result Date: 01/06/2021 Multiple radiographic views left shoulder reviewed.  Three-part proximal humerus fracture unchanged position and alignment.  Minimal callus formation present around the fracture edges.  Shoulder remains located.  No change in position of major fragments compared to prior radiographs   PMFS History: Patient Active Problem List   Diagnosis Date Noted   Migraine    Migraine without aura 12/30/2013   Past Medical History:  Diagnosis Date   Migraine    Migraine without aura, without mention of intractable migraine without mention of status migrainosus 12/30/2013   Renal calculi     Family History   Problem Relation Age of Onset   Neuropathy Father    Migraines Mother    Migraines Daughter     Past Surgical History:  Procedure Laterality Date   ELBOW SURGERY Right 08/2014   PARTIAL HYSTERECTOMY     scar tissue removal  1995   leg   Social History   Occupational History   Occupation: hair stylist  Tobacco Use   Smoking status: Never   Smokeless tobacco: Never  Substance and Sexual Activity   Alcohol use: Yes    Alcohol/week: 0.0 standard drinks    Comment: occasional   Drug use: No   Sexual activity: Not on file

## 2021-02-03 ENCOUNTER — Telehealth: Payer: Self-pay

## 2021-02-03 ENCOUNTER — Other Ambulatory Visit: Payer: Self-pay

## 2021-02-03 ENCOUNTER — Ambulatory Visit (INDEPENDENT_AMBULATORY_CARE_PROVIDER_SITE_OTHER): Payer: Commercial Managed Care - PPO

## 2021-02-03 ENCOUNTER — Encounter: Payer: Self-pay | Admitting: Orthopedic Surgery

## 2021-02-03 ENCOUNTER — Ambulatory Visit: Payer: Commercial Managed Care - PPO | Admitting: Orthopedic Surgery

## 2021-02-03 DIAGNOSIS — S42292A Other displaced fracture of upper end of left humerus, initial encounter for closed fracture: Secondary | ICD-10-CM | POA: Diagnosis not present

## 2021-02-03 NOTE — Telephone Encounter (Signed)
Patient called asking for note stating she was seen in our office today. Note generated for patient.

## 2021-02-03 NOTE — Progress Notes (Signed)
   Post-Op Visit Note   Patient: Rebecca Alvarado           Date of Birth: [birth year]           MRN: [id number] Visit Date: 02/03/2021 PCP: Dois Davenport, MD   Assessment & Plan:  Chief Complaint:  Chief Complaint  Patient presents with   Left Shoulder - Follow-up   Visit Diagnoses:  1. Closed 4-part fracture of proximal humerus, left, initial encounter     Plan: Rebecca Alvarado is a 55 year old patient who is now 10 weeks out left proximal humerus fracture.  She is been doing therapy for range of motion.  On exam she has pretty good range of motion today and the fracture moves as a unit.  Rotator cuff strength is still weak to infraspinatus supraspinatus and subscap testing.  Plan is okay to start doing some strengthening at this time.  4-week return.  Anticipate more callus formation and improvement in strength at that time.  If not then we would need to consider CT scanning to evaluate for fracture delayed union.  It is moving as noted today with rotation without pain so I think that is an encouraging sign.  Would like to see more strength with resisted rotator cuff strength testing next clinic visit however.  Follow-Up Instructions: Return in about 4 weeks (around 03/03/2021).   Orders:  Orders Placed This Encounter  Procedures   XR Shoulder Left   No orders of the defined types were placed in this encounter.   Imaging: XR Shoulder Left  Result Date: 02/03/2021 AP lateral merchant radiographs left shoulder reviewed.  Proximal humerus fracture again noted with some callus formation noted around the fracture site.  No change in fracture alignment.  Shoulder is located.   PMFS History: Patient Active Problem List   Diagnosis Date Noted   Migraine    Migraine without aura 12/30/2013   Past Medical History:  Diagnosis Date   Migraine    Migraine without aura, without mention of intractable migraine without mention of status migrainosus 12/30/2013   Renal calculi     Family History   Problem Relation Age of Onset   Neuropathy Father    Migraines Mother    Migraines Daughter     Past Surgical History:  Procedure Laterality Date   ELBOW SURGERY Right 08/2014   PARTIAL HYSTERECTOMY     scar tissue removal  1995   leg   Social History   Occupational History   Occupation: hair stylist  Tobacco Use   Smoking status: Never   Smokeless tobacco: Never  Substance and Sexual Activity   Alcohol use: Yes    Alcohol/week: 0.0 standard drinks    Comment: occasional   Drug use: No   Sexual activity: Not on file

## 2021-02-15 ENCOUNTER — Encounter: Payer: Self-pay | Admitting: Family Medicine

## 2021-02-17 ENCOUNTER — Encounter: Payer: Self-pay | Admitting: Orthopedic Surgery

## 2021-02-17 NOTE — Telephone Encounter (Signed)
May take a while

## 2021-02-24 NOTE — Telephone Encounter (Signed)
Is G43.009 the correct dx code to use?

## 2021-03-02 ENCOUNTER — Other Ambulatory Visit: Payer: Self-pay

## 2021-03-02 ENCOUNTER — Ambulatory Visit (INDEPENDENT_AMBULATORY_CARE_PROVIDER_SITE_OTHER): Payer: Commercial Managed Care - PPO | Admitting: Orthopedic Surgery

## 2021-03-02 ENCOUNTER — Ambulatory Visit (INDEPENDENT_AMBULATORY_CARE_PROVIDER_SITE_OTHER): Payer: Commercial Managed Care - PPO

## 2021-03-02 DIAGNOSIS — S42292A Other displaced fracture of upper end of left humerus, initial encounter for closed fracture: Secondary | ICD-10-CM | POA: Diagnosis not present

## 2021-03-03 ENCOUNTER — Ambulatory Visit: Payer: Commercial Managed Care - PPO | Admitting: Orthopedic Surgery

## 2021-03-04 ENCOUNTER — Encounter: Payer: Self-pay | Admitting: Orthopedic Surgery

## 2021-03-04 NOTE — Progress Notes (Signed)
Post-fracture visit Note   Patient: Rebecca Alvarado           Date of Birth: Jun 03, 1966           MRN: [id number] Visit Date: 03/02/2021 PCP: Dois Davenport, MD   Assessment & Plan:  Chief Complaint:  Chief Complaint  Patient presents with   Left Shoulder - Fracture, Follow-up    DOI 11/27/20-4 part proximal humerus fx   Visit Diagnoses:  1. Closed 4-part fracture of proximal humerus, left, initial encounter     Plan: Patient is a 55 year old female who returns for evaluation of proximal humerus fracture.  She feels like she is making slow progress but steady progress.  She complains of continued soreness.  She is no longer waking with pain but does complain of continued stiffness in the morning after getting up from bed.  Only taking occasional Advil for pain control at this point.  She is going to physical therapy 2 times per week and doing a home exercise program 2 times per day.  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  She has had some difficulty with this but as long as she keeps her arms close to her body she is able to manage okay.  She is currently taking vitamin D and magnesium.  On exam she has 30 degrees external rotation, 80 degrees abduction, 140 degrees forward flexion passively with active range of motion equivalent to passive range of motion.  She does have continued significant weakness with infraspinatus and supraspinatus but subscapularis has decent strength rated 5 -/5.  Positive Hornblower sign.  Functionally, she has pretty good range of motion which is excellent for where she is in recovery.  Only concern is the weakness she is having.  Radiographs taken today do demonstrate no significant callus formation since last set of radiographs 1 month ago.  Greater tuberosity fragment demonstrating no callus formation either which may be responsible for a lot of the weakness she is experiencing.  With no significant new callus  formation noted, plan to order CT of the left shoulder for further evaluation of nonunion.  Follow-up after CT scan to review results and decide on best next steps.  Follow-Up Instructions: No follow-ups on file.   Orders:  Orders Placed This Encounter  Procedures   XR Shoulder Left   CT SHOULDER LEFT WO CONTRAST   No orders of the defined types were placed in this encounter.   Imaging: No results found.  PMFS History: Patient Active Problem List   Diagnosis Date Noted   Migraine    Migraine without aura 12/30/2013   Past Medical History:  Diagnosis Date   Migraine    Migraine without aura, without mention of intractable migraine without mention of status migrainosus 12/30/2013   Renal calculi     Family History  Problem Relation Age of Onset   Neuropathy Father    Migraines Mother    Migraines Daughter     Past Surgical History:  Procedure Laterality Date   ELBOW SURGERY Right 08/2014   PARTIAL HYSTERECTOMY     scar tissue removal  1995   leg   Social History   Occupational History   Occupation: hair stylist  Tobacco Use   Smoking status: Never   Smokeless tobacco: Never  Substance and Sexual Activity   Alcohol use: Yes    Alcohol/week: 0.0 standard drinks    Comment: occasional   Drug use: No  Sexual activity: Not on file

## 2021-03-08 ENCOUNTER — Telehealth: Payer: Self-pay | Admitting: Family Medicine

## 2021-03-08 NOTE — Telephone Encounter (Signed)
Please see patient's MyChart message thread, per Amy patient would like to try Botox for her migraines (G43.009). I called [facility] @ [phone] and spoke with Toni Amend to check CPT codes [phone] and [id number]. Toni Amend states it does not look like Berkley Harvey is required, but she advised me to check with the [facility] care PA team (direct: [phone]). Call reference [phone]. Courtney transferred me to Zollie Beckers in the PA department. Zollie Beckers also states no PA is required. Reference #[id number]. Patient will need to sign consent form for Botox at first appointment. I will check the schedule for openings and call the patient when a slot in the near future is available.

## 2021-03-17 ENCOUNTER — Ambulatory Visit
Admission: RE | Admit: 2021-03-17 | Discharge: 2021-03-17 | Disposition: A | Discharge status: Home / Self Care | Payer: Commercial Managed Care - PPO | Source: Ambulatory Visit | Attending: Orthopedic Surgery | Admitting: Orthopedic Surgery

## 2021-03-17 DIAGNOSIS — S42292A Other displaced fracture of upper end of left humerus, initial encounter for closed fracture: Secondary | ICD-10-CM

## 2021-03-20 NOTE — Progress Notes (Signed)
Eyelid.  Can you call the radiologist and see if he thinks that the humeral head looks like it has AVN.  A lot of cystic changes on the coronal view.  Would like to try to make that diagnosis without putting her through the expense of an MRI scan but if he cannot say for sure if AVN is present we may need to do that because that will guide our treatment thanks

## 2021-03-21 ENCOUNTER — Ambulatory Visit (INDEPENDENT_AMBULATORY_CARE_PROVIDER_SITE_OTHER): Payer: Commercial Managed Care - PPO | Admitting: Orthopedic Surgery

## 2021-03-21 ENCOUNTER — Encounter: Payer: Self-pay | Admitting: Orthopedic Surgery

## 2021-03-21 ENCOUNTER — Other Ambulatory Visit: Payer: Self-pay

## 2021-03-21 DIAGNOSIS — S42292A Other displaced fracture of upper end of left humerus, initial encounter for closed fracture: Secondary | ICD-10-CM

## 2021-03-21 NOTE — Progress Notes (Signed)
Office Visit Note   Patient: Rebecca Alvarado           Date of Birth: September 16, 1965           MRN: [id number] Visit Date: 03/21/2021 Requested by: Dois Davenport, MD [address] [place],  [place] [address] PCP: Dois Davenport, MD  Subjective: Chief Complaint  Patient presents with   Left Shoulder - Follow-up    HPI: Rebecca Alvarado is a 55 y.o. female who presents to the office for MRI review. Patient denies any changes in symptoms.  Continues to complain mainly of left shoulder pain with aching pain mostly in the humerus.  Takes over-the-counter Advil as needed.  Complains of continued weakness.  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  She endorses a lot of fatigue and pain with extended periods of keeping her shoulder elevated.  No history of prior surgery to the left shoulder.  She does have history of vitamin D deficiency.  CT scan results revealed: 1. 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 Interval healing of the fracture of the left greater tuberosity.  No evidence of avascular necrosis.  Disuse osteopenia is present.               ROS: All systems reviewed are negative as they relate to the chief complaint within the history of present illness.  Patient denies fevers or chills.  Assessment & Plan: Visit Diagnoses:  1. Closed 4-part fracture of proximal humerus, left, initial encounter     Plan: Rebecca Alvarado is a 55 y.o. female who presents to the office for review of CT scan of the left shoulder.  Left shoulder CT scan was obtained for evaluation of nonunion of proximal humerus fracture.  CT scan does show proximal humerus without significant callus formation or bone bridging.  She has excellent functional range of motion but she does endorse continued pain  that wakes her up at night and fatigue with the arm.  She has significant weakness with external rotation and some weakness with supraspinatus resistance testing on exam.  Subscapularis with 5 -/5 strength.  CT scan was reviewed with the patient today and does seem to show progressive osteopenia in just the last 3 months compared with prior CT scan especially throughout the humeral head.  There is concern for avascular necrosis which was not definitively demonstrated on CT scan so plan to order MRI which has the best specificity for identified AVN.  Follow-up after MRI to review results.   The main treatment options hinges on whether or not AVN is present.  If AVN is not present, could consider continuing nonoperative management in the form of physical therapy and bone stimulator given her nonunion.  Could also consider ORIF if no AVN is present.  Alternatively, if there is evidence of early AVN in the proximal humerus, would discourage ORIF as an option.  In the case of early AVN, would want to optimize her function nonoperatively with continued physical therapy and bone stimulator to treat the nonunion or consider reverse shoulder arthroplasty though this is not optimal for this patient given her young age.  However, after discussion with Rebecca Alvarado today, she feels that as the shoulder is now she cannot really function to do her  job and she has significant daily pain that wakes her up at night so she would lean toward reverse shoulder arthroplasty.  Regardless, plan to obtain MRI to further delineate treatment options.  Follow-Up Instructions: No follow-ups on file.   Orders:  Orders Placed This Encounter  Procedures   Vitamin D (25 hydroxy)   No orders of the defined types were placed in this encounter.     Procedures: No procedures performed   Clinical Data: No additional findings.  Objective: Vital Signs: There were no vitals taken for this visit.  Physical Exam:  Constitutional: Patient  appears well-developed HEENT:  Head: Normocephalic Eyes:EOM are normal Neck: Normal range of motion Cardiovascular: Normal rate Pulmonary/chest: Effort normal Neurologic: Patient is alert Skin: Skin is warm Psychiatric: Patient has normal mood and affect  Ortho Exam: Ortho exam demonstrates 45 degrees external rotation, 90 degrees abduction, 160 degrees forward flexion.  Active range of motion equivalent to passive range of motion.  No crepitus noted at the fracture site and fracture seems to move as 1 unit.  Positive belly press test.  Positive external rotation lag sign.  Positive Hornblower sign.  Infraspinatus with 4/5 motor strength.  Supraspinatus with 4+/5 motor strength.  Subscapularis with 5 -/5 motor strength.  Specialty Comments:  No specialty comments available.  Imaging: No results found.   PMFS History: Patient Active Problem List   Diagnosis Date Noted   Migraine    Migraine without aura 12/30/2013   Past Medical History:  Diagnosis Date   Migraine    Migraine without aura, without mention of intractable migraine without mention of status migrainosus 12/30/2013   Renal calculi     Family History  Problem Relation Age of Onset   Neuropathy Father    Migraines Mother    Migraines Daughter     Past Surgical History:  Procedure Laterality Date   ELBOW SURGERY Right 08/2014   PARTIAL HYSTERECTOMY     scar tissue removal  1995   leg   Social History   Occupational History   Occupation: hair stylist  Tobacco Use   Smoking status: Never   Smokeless tobacco: Never  Substance and Sexual Activity   Alcohol use: Yes    Alcohol/week: 0.0 standard drinks    Comment: occasional   Drug use: No   Sexual activity: Not on file

## 2021-03-22 ENCOUNTER — Other Ambulatory Visit: Payer: Self-pay

## 2021-03-22 ENCOUNTER — Encounter: Payer: Self-pay | Admitting: Orthopedic Surgery

## 2021-03-22 ENCOUNTER — Ambulatory Visit: Payer: Commercial Managed Care - PPO | Admitting: Orthopaedic Surgery

## 2021-03-22 DIAGNOSIS — S42292A Other displaced fracture of upper end of left humerus, initial encounter for closed fracture: Secondary | ICD-10-CM

## 2021-03-22 LAB — VITAMIN D 25 HYDROXY (VIT D DEFICIENCY, FRACTURES): Vit D, 25-Hydroxy: 35 ng/mL (ref 30–100)

## 2021-03-23 ENCOUNTER — Encounter: Payer: Self-pay | Admitting: Orthopedic Surgery

## 2021-03-30 ENCOUNTER — Ambulatory Visit: Payer: Commercial Managed Care - PPO | Admitting: Family Medicine

## 2021-04-05 ENCOUNTER — Telehealth: Payer: Self-pay | Admitting: Orthopedic Surgery

## 2021-04-05 NOTE — Telephone Encounter (Signed)
Called patient left message to return call to schedule an MRI review with Dr. Dean 

## 2021-04-09 ENCOUNTER — Ambulatory Visit
Admission: RE | Admit: 2021-04-09 | Discharge: 2021-04-09 | Disposition: A | Discharge status: Home / Self Care | Payer: Commercial Managed Care - PPO | Source: Ambulatory Visit | Attending: Surgical | Admitting: Surgical

## 2021-04-09 DIAGNOSIS — S42292A Other displaced fracture of upper end of left humerus, initial encounter for closed fracture: Secondary | ICD-10-CM

## 2021-04-13 ENCOUNTER — Other Ambulatory Visit: Payer: Self-pay

## 2021-04-13 ENCOUNTER — Encounter: Payer: Self-pay | Admitting: Orthopedic Surgery

## 2021-04-13 ENCOUNTER — Ambulatory Visit: Payer: Commercial Managed Care - PPO | Admitting: Orthopedic Surgery

## 2021-04-13 DIAGNOSIS — S42292A Other displaced fracture of upper end of left humerus, initial encounter for closed fracture: Secondary | ICD-10-CM | POA: Diagnosis not present

## 2021-04-13 DIAGNOSIS — S42202K Unspecified fracture of upper end of left humerus, subsequent encounter for fracture with nonunion: Secondary | ICD-10-CM

## 2021-04-13 NOTE — Progress Notes (Signed)
Office Visit Note   Patient: Rebecca Alvarado           Date of Birth: 17-Aug-1965           MRN: [id number] Visit Date: 04/13/2021 Requested by: Dois Davenport, MD [address] [place],  [place] [address] PCP: Dois Davenport, MD  Subjective: Chief Complaint  Patient presents with  . Other     Scan review    HPI: Rebecca Alvarado is a 55 y.o. female who presents to the office for MRI review. Patient denies any changes in symptoms.  Continues to complain mainly of left shoulder pain with fatigue when she tries to work.  Wakes with pain most nights.  MRI results revealed: MR Shoulder Left w/o contrast  Result Date: 04/12/2021 CLINICAL DATA:  Status post fall 6 months ago. Patient states she has pain anterior to humerus with limited range of motion and popping. Patient denies cancer, therapeutic injections or surgeries. EXAM: MRI OF THE LEFT SHOULDER WITHOUT CONTRAST TECHNIQUE: Multiplanar, multisequence MR imaging of the shoulder was performed. No intravenous contrast was administered. COMPARISON:  CT shoulder 03/17/2021; X-ray shoulder 03/02/2021, 11/27/2020. FINDINGS: Rotator cuff: Intermediate intrasubstance signal within the distal aspects of the supraspinatus, infraspinatus, and subscapularis tendons, which may represent tendinosis or tendon strain. No rotator cuff tear. Teres minor intact. Muscles: Preserved bulk and signal intensity of the rotator cuff musculature without edema, atrophy, or fatty infiltration. Biceps long head:  Intact. Acromioclavicular Joint: Mild arthropathy of the AC joint. No significant subacromial-subdeltoid bursal fluid. Glenohumeral Joint: Multifocal areas of partial thickness chondral loss involving the humeral head and glenoid. No glenohumeral joint effusion. Labrum: No displaced labral tear. Evaluation is limited on non-arthrographic imaging. 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 Intermediate  T1/T2 signal intensity tissue traversing the fracture site, may represent developing pseudoarthrosis. There is prominent bone marrow edema within the humeral neck and proximal diaphysis. A 2.5 cm linear cortical fragment is partially located within the medullary space of the proximal humeral head and neck laterally (series 9, image 13). Previously seen greater tuberosity nondisplaced fracture is healed. No new fractures. No dislocation. Other: No soft tissue edema or fluid collection. IMPRESSION: 1. 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 Intermediate signal intensity tissue traversing the fracture site may represent developing pseudoarthrosis. There is prominent bone marrow edema within the humeral neck and proximal diaphysis, likely related to abnormal motion across the fracture site. 2. Previously seen greater tuberosity fracture is healed. 3. Mild rotator cuff tendinosis or tendon strain. No rotator cuff tear. 4. 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 5. Mild acromioclavicular osteoarthritis. Electronically Signed   By: Duanne Guess D.O.   On: 04/12/2021 16:21                 ROS: All systems reviewed are negative as they relate to the chief complaint within the history of present illness.  Patient denies fevers or chills.  Assessment & Plan: Visit Diagnoses:  1. Closed 4-part fracture of proximal humerus, left, initial encounter   2. 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 female who presents to the office for evaluation and review of MRI left shoulder.  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  There is no evidence of avascular  necrosis and the greater tuberosity fracture does appear to be healed.  Discussed the findings at length with her  and reviewed the images.  Based on the lack of avascular necrosis and the age of the patient, do not think that shoulder arthroplasty is the optimal solution to her problem.  In order to prolong the life of her native shoulder and put off the need for any shoulder placement, best option would be to remove tissue from the developing pseudoarthrosis and place bone graft at the fracture site with bone marrow aspirate from her hip along with plate/screw fixation at the fracture site.  It is unclear exactly why this fracture failed to heal appropriately, though the geometry of the fracture may have played a role.  With this in mind, plan to order labs for evaluation of potential reasons of nonunion.  No history of smoking.  She will be posted for surgery and follow-up after procedure.  There was a lengthy discussion including the risks and benefits of the surgical procedure including the risk of nerve/vessel damage, shoulder stiffness, infection, failure to unite the fracture, need for revision surgery in the future.  This was discussed in depth and patient is understanding that even despite this procedure she could go on to develop AVN within the next several years and require shoulder arthroplasty in the near future.  Follow-Up Instructions: No follow-ups on file.   Orders:  Orders Placed This Encounter  Procedures  . Thyroid Panel With TSH  . Prealbumin  . PTH, Intact and Calcium  . Phosphorus  . HgB A1c   No orders of the defined types were placed in this encounter.     Procedures: No procedures performed   Clinical Data: No additional findings.  Objective: Vital Signs: There were no vitals taken for this visit.  Physical Exam:  Constitutional: Patient appears well-developed HEENT:  Head: Normocephalic Eyes:EOM are normal Neck: Normal range of motion Cardiovascular: Normal rate Pulmonary/chest: Effort normal Neurologic: Patient is alert Skin: Skin is warm Psychiatric: Patient has  normal mood and affect  Ortho Exam: Ortho exam demonstrates left shoulder with 45 degrees external rotation, 95 degrees abduction, 170 degrees forward flexion.  No tenderness to palpation throughout the humeral shaft.  No significant, noticeable motion of the fracture site is discernible.  She is able to actively forward flex to about 110 degrees and actively AB duct to about 60 to 70 degrees.  She has weakness with supraspinatus, infraspinatus, subscapularis that is rated about 4/5.  Axillary nerve intact with deltoid firing.  2+ radial pulse of the left upper extremity.  Specialty Comments:  No specialty comments available.  Imaging: No results found.   PMFS History: Patient Active Problem List   Diagnosis Date Noted  . Migraine   . Migraine without aura 12/30/2013   Past Medical History:  Diagnosis Date  . Migraine   . Migraine without aura, without mention of intractable migraine without mention of status migrainosus 12/30/2013  . Renal calculi     Family History  Problem Relation Age of Onset  . Neuropathy Father   . Migraines Mother   . Migraines Daughter     Past Surgical History:  Procedure Laterality Date  . ELBOW SURGERY Right 08/2014  . PARTIAL HYSTERECTOMY    . scar tissue removal  1995   leg   Social History   Occupational History  . Occupation: hair stylist  Tobacco Use  . Smoking status: Never  . Smokeless tobacco: Never  Substance and Sexual  Activity  . Alcohol use: Yes    Alcohol/week: 0.0 standard drinks    Comment: occasional  . Drug use: No  . Sexual activity: Not on file

## 2021-04-14 LAB — HEMOGLOBIN A1C
Hgb A1c MFr Bld: 5.1 % of total Hgb (ref <5.7)
Mean Plasma Glucose: 100 mg/dL
eAG (mmol/L): 5.5 mmol/L

## 2021-04-14 LAB — THYROID PANEL WITH TSH
Free Thyroxine Index: 2.1 (ref 1.4–3.8)
T3 Uptake: 30 % (ref 22–35)
T4, Total: 7.1 ug/dL (ref 5.1–11.9)
TSH: 2.37 mIU/L (ref 0.40–4.50)

## 2021-04-14 LAB — PTH, INTACT AND CALCIUM
Calcium: 9.9 mg/dL (ref 8.6–10.4)
PTH: 44 pg/mL (ref 16–77)

## 2021-04-14 LAB — PREALBUMIN: Prealbumin: 26 mg/dL (ref 17–34)

## 2021-04-14 LAB — PHOSPHORUS: Phosphorus: 4.1 mg/dL (ref 2.5–4.5)

## 2021-04-18 ENCOUNTER — Other Ambulatory Visit: Payer: Self-pay

## 2021-04-19 NOTE — Progress Notes (Signed)
Surgical Instructions    Your procedure is scheduled on Friday October 28.  Report to [facility] Main Entrance "A" at 7:00 A.M., then check in with the Admitting office.  Call this number if you have problems the morning of surgery:  [phone]   If you have any questions prior to your surgery date call [phone]: Open Monday-Friday 8am-4pm    Remember:  Do not eat after midnight the night before your surgery  You may drink clear liquids until 6am the morning of your surgery.   Clear liquids allowed are: Water, Non-Citrus Juices (without pulp), Carbonated Beverages, Clear Tea, Black Coffee ONLY (NO MILK, CREAM OR POWDERED CREAMER of any kind), and Gatorade    Take these medicines the morning of surgery with A SIP OF WATER : RELPAX if needed Polyethylene Glycol  if needed  As of today, STOP taking any Aspirin (unless otherwise instructed by your surgeon) Aleve, Naproxen, Ibuprofen, Motrin, Advil, Goody's, BC's, all herbal medications, fish oil, and all vitamins.     After your COVID test   You are not required to quarantine however you are required to wear a well-fitting mask when you are out and around people not in your household.  If your mask becomes wet or soiled, replace with a new one.  Wash your hands often with soap and water for 20 seconds or clean your hands with an alcohol-based hand sanitizer that contains at least 60% alcohol.  Do not share personal items.  Notify your provider: if you are in close contact with someone who has COVID  or if you develop a fever of 100.4 or greater, sneezing, cough, sore throat, shortness of breath or body aches.             Do not wear jewelry or makeup Do not wear lotions, powders, perfumes/colognes, or deodorant. Do not shave 48 hours prior to surgery.  Men may shave face and neck. Do not bring valuables to the hospital. DO Not wear nail polish, gel polish, artificial nails, or any other type of covering on natural nails  including finger and toenails. If patients have artificial nails, gel coating, etc. that need to be removed by a nail salon, please have this removed prior to surgery or surgery may need to be canceled/delayed if the surgeon/ anesthesia feels like the patient is unable to be adequately monitored.             [facility] is not responsible for any belongings or valuables.  Do NOT Smoke (Tobacco/Vaping)  24 hours prior to your procedure  If you use a CPAP at night, you may bring your mask for your overnight stay.   Contacts, glasses, hearing aids, dentures or partials may not be worn into surgery, please bring cases for these belongings   For patients admitted to the hospital, discharge time will be determined by your treatment team.   Patients discharged the day of surgery will not be allowed to drive home, and someone needs to stay with them for 24 hours.  NO VISITORS WILL BE ALLOWED IN PRE-OP WHERE PATIENTS ARE PREPPED FOR SURGERY.  ONLY 1 SUPPORT PERSON MAY BE PRESENT IN THE WAITING ROOM WHILE YOU ARE IN SURGERY.  IF YOU ARE TO BE ADMITTED, ONCE YOU ARE IN YOUR ROOM YOU WILL BE ALLOWED TWO (2) VISITORS. 1 (ONE) VISITOR MAY STAY OVERNIGHT BUT MUST ARRIVE TO THE ROOM BY 8pm.  Minor children may have two parents present. Special consideration for safety and communication needs  will be reviewed on a case by case basis.  Special instructions:    Oral Hygiene is also important to reduce your risk of infection.  Remember - BRUSH YOUR TEETH THE MORNING OF SURGERY WITH YOUR REGULAR TOOTHPASTE   [facility]- Preparing For Surgery  Before surgery, you can play an important role. Because skin is not sterile, your skin needs to be as free of germs as possible. You can reduce the number of germs on your skin by washing with CHG (chlorahexidine gluconate) Soap before surgery.  CHG is an antiseptic cleaner which kills germs and bonds with the skin to continue killing germs even after washing.     Please  do not use if you have an allergy to CHG or antibacterial soaps. If your skin becomes reddened/irritated stop using the CHG.  Do not shave (including legs and underarms) for at least 48 hours prior to first CHG shower. It is OK to shave your face.  Please follow these instructions carefully.     Shower the NIGHT BEFORE SURGERY and the MORNING OF SURGERY with CHG Soap.   If you chose to wash your hair, wash your hair first as usual with your normal shampoo. After you shampoo, rinse your hair and body thoroughly to remove the shampoo.  Then [employer] and genitals (private parts) with your normal soap and rinse thoroughly to remove soap.  After that Use CHG Soap as you would any other liquid soap. You can apply CHG directly to the skin and wash gently with a scrungie or a clean washcloth.   Apply the CHG Soap to your body ONLY FROM THE NECK DOWN.  Do not use on open wounds or open sores. Avoid contact with your eyes, ears, mouth and genitals (private parts). Wash Face and genitals (private parts)  with your normal soap.   Wash thoroughly, paying special attention to the area where your surgery will be performed.  Thoroughly rinse your body with warm water from the neck down.  DO NOT shower/wash with your normal soap after using and rinsing off the CHG Soap.  Pat yourself dry with a CLEAN TOWEL.  Wear CLEAN PAJAMAS to bed the night before surgery  Place CLEAN SHEETS on your bed the night before your surgery  DO NOT SLEEP WITH PETS.   Day of Surgery:  Take a shower with CHG soap. Wear Clean/Comfortable clothing the morning of surgery Do not apply any deodorants/lotions.   Remember to brush your teeth WITH YOUR REGULAR TOOTHPASTE.   Please read over the following fact sheets that you were given.

## 2021-04-20 ENCOUNTER — Encounter (HOSPITAL_COMMUNITY)
Admission: RE | Admit: 2021-04-20 | Discharge: 2021-04-20 | Disposition: A | Discharge status: Home / Self Care | Payer: Commercial Managed Care - PPO | Source: Ambulatory Visit | Attending: Orthopedic Surgery | Admitting: Orthopedic Surgery

## 2021-04-20 ENCOUNTER — Other Ambulatory Visit: Payer: Self-pay

## 2021-04-20 ENCOUNTER — Encounter (HOSPITAL_COMMUNITY): Payer: Self-pay

## 2021-04-20 DIAGNOSIS — Z01812 Encounter for preprocedural laboratory examination: Secondary | ICD-10-CM | POA: Insufficient documentation

## 2021-04-20 LAB — CBC
HCT: 41.5 % (ref 36.0–46.0)
Hemoglobin: 13.6 g/dL (ref 12.0–15.0)
MCH: 31.1 pg (ref 26.0–34.0)
MCHC: 32.8 g/dL (ref 30.0–36.0)
MCV: 95 fL (ref 80.0–100.0)
Platelets: 277 10*3/uL (ref 150–400)
RBC: 4.37 MIL/uL (ref 3.87–5.11)
RDW: 12.7 % (ref 11.5–15.5)
WBC: 8.9 10*3/uL (ref 4.0–10.5)
nRBC: 0 % (ref 0.0–0.2)

## 2021-04-20 NOTE — Progress Notes (Signed)
PCP - Dr. Hal Hope Cardiologist - denies   ERAS Protcol - clears until 0600 PRE-SURGERY Ensure or G2- n/a  COVID TEST- ambulatory sx   Anesthesia review: n/a  Patient denies shortness of breath, fever, cough and chest pain at PAT appointment   All instructions explained to the patient, with a verbal understanding of the material. Patient agrees to go over the instructions while at home for a better understanding. Patient also instructed to self quarantine after being tested for COVID-19. The opportunity to ask questions was provided.

## 2021-04-28 NOTE — Anesthesia Preprocedure Evaluation (Addendum)
Anesthesia Evaluation  Patient identified by MRN, date of birth, ID band Patient awake    Reviewed: Allergy & Precautions, NPO status , Patient's Chart, lab work & pertinent test results  Airway Mallampati: IV  TM Distance: >3 FB Neck ROM: Full    Dental  (+) Chipped, Dental Advisory Given,    Pulmonary neg pulmonary ROS,    Pulmonary exam normal breath sounds clear to auscultation       Cardiovascular negative cardio ROS Normal cardiovascular exam Rhythm:Regular Rate:Normal     Neuro/Psych  Headaches, negative psych ROS   GI/Hepatic Neg liver ROS, GERD  Medicated and Controlled,  Endo/Other  negative endocrine ROS  Renal/GU negative Renal ROS  negative genitourinary   Musculoskeletal negative musculoskeletal ROS (+)   Abdominal   Peds  Hematology negative hematology ROS (+)   Anesthesia Other Findings   Reproductive/Obstetrics                            Anesthesia Physical Anesthesia Plan  ASA: 2  Anesthesia Plan: General and Regional   Post-op Pain Management:  Regional for Post-op pain   Induction: Intravenous  PONV Risk Score and Plan: 3 and Midazolam, Dexamethasone and Ondansetron  Airway Management Planned: Oral ETT  Additional Equipment:   Intra-op Plan:   Post-operative Plan: Extubation in OR  Informed Consent: I have reviewed the patients History and Physical, chart, labs and discussed the procedure including the risks, benefits and alternatives for the proposed anesthesia with the patient or authorized representative who has indicated his/her understanding and acceptance.     Dental advisory given  Plan Discussed with: CRNA  Anesthesia Plan Comments:         Anesthesia Quick Evaluation

## 2021-04-29 ENCOUNTER — Encounter (HOSPITAL_COMMUNITY)
Admission: RE | Disposition: A | Discharge status: Home / Self Care | Payer: Self-pay | Source: Ambulatory Visit | Attending: Orthopedic Surgery

## 2021-04-29 ENCOUNTER — Ambulatory Visit (HOSPITAL_COMMUNITY)
Admission: RE | Admit: 2021-04-29 | Discharge: 2021-04-29 | Disposition: A | Discharge status: Home / Self Care | Payer: Commercial Managed Care - PPO | Source: Ambulatory Visit | Attending: Orthopedic Surgery | Admitting: Orthopedic Surgery

## 2021-04-29 ENCOUNTER — Other Ambulatory Visit: Payer: Self-pay

## 2021-04-29 ENCOUNTER — Ambulatory Visit (HOSPITAL_COMMUNITY): Payer: Commercial Managed Care - PPO

## 2021-04-29 ENCOUNTER — Ambulatory Visit (HOSPITAL_COMMUNITY): Payer: Commercial Managed Care - PPO | Admitting: Anesthesiology

## 2021-04-29 ENCOUNTER — Encounter (HOSPITAL_COMMUNITY): Payer: Self-pay | Admitting: Orthopedic Surgery

## 2021-04-29 DIAGNOSIS — Z820 Family history of epilepsy and other diseases of the nervous system: Secondary | ICD-10-CM | POA: Diagnosis not present

## 2021-04-29 DIAGNOSIS — Z888 Allergy status to other drugs, medicaments and biological substances status: Secondary | ICD-10-CM | POA: Insufficient documentation

## 2021-04-29 DIAGNOSIS — Y939 Activity, unspecified: Secondary | ICD-10-CM | POA: Diagnosis not present

## 2021-04-29 DIAGNOSIS — Z79899 Other long term (current) drug therapy: Secondary | ICD-10-CM | POA: Diagnosis not present

## 2021-04-29 DIAGNOSIS — Z91018 Allergy to other foods: Secondary | ICD-10-CM | POA: Diagnosis not present

## 2021-04-29 DIAGNOSIS — X58XXXA Exposure to other specified factors, initial encounter: Secondary | ICD-10-CM | POA: Insufficient documentation

## 2021-04-29 DIAGNOSIS — Z419 Encounter for procedure for purposes other than remedying health state, unspecified: Secondary | ICD-10-CM

## 2021-04-29 DIAGNOSIS — S42292K Other displaced fracture of upper end of left humerus, subsequent encounter for fracture with nonunion: Secondary | ICD-10-CM

## 2021-04-29 DIAGNOSIS — S42232A 3-part fracture of surgical neck of left humerus, initial encounter for closed fracture: Secondary | ICD-10-CM | POA: Insufficient documentation

## 2021-04-29 DIAGNOSIS — Z885 Allergy status to narcotic agent status: Secondary | ICD-10-CM | POA: Diagnosis not present

## 2021-04-29 DIAGNOSIS — Z01818 Encounter for other preprocedural examination: Secondary | ICD-10-CM

## 2021-04-29 HISTORY — PX: ORIF HUMERUS FRACTURE: SHX2126

## 2021-04-29 LAB — BASIC METABOLIC PANEL
Anion gap: 10 (ref 5–15)
BUN: 17 mg/dL (ref 6–20)
CO2: 20 mmol/L — ABNORMAL LOW (ref 22–32)
Calcium: 9 mg/dL (ref 8.9–10.3)
Chloride: 107 mmol/L (ref 98–111)
Creatinine, Ser: 0.72 mg/dL (ref 0.44–1.00)
GFR, Estimated: >60 mL/min (ref >60)
Glucose, Bld: 95 mg/dL (ref 70–99)
Potassium: 3.7 mmol/L (ref 3.5–5.1)
Sodium: 137 mmol/L (ref 135–145)

## 2021-04-29 SURGERY — OPEN REDUCTION INTERNAL FIXATION (ORIF) PROXIMAL HUMERUS FRACTURE
Anesthesia: Regional | Site: Arm Upper | Laterality: Left

## 2021-04-29 MED ORDER — POVIDONE-IODINE 10 % EX SWAB
2.0000 "application " | Freq: Once | CUTANEOUS | Status: AC
  Administered 2021-04-29: 2 via TOPICAL

## 2021-04-29 MED ORDER — BUPIVACAINE HCL (PF) 0.25 % IJ SOLN
INTRAMUSCULAR | Status: AC
  Filled 2021-04-29: qty 30

## 2021-04-29 MED ORDER — CEFAZOLIN SODIUM-DEXTROSE 2-4 GM/100ML-% IV SOLN
2.0000 g | INTRAVENOUS | Status: AC
  Administered 2021-04-29: 2 g via INTRAVENOUS
  Filled 2021-04-29: qty 100

## 2021-04-29 MED ORDER — OXYCODONE-ACETAMINOPHEN 5-325 MG PO TABS: 1.0000 | ORAL_TABLET | ORAL | 0 refills | Status: DC | PRN

## 2021-04-29 MED ORDER — ONDANSETRON HCL 4 MG/2ML IJ SOLN
INTRAMUSCULAR | Status: AC
  Filled 2021-04-29: qty 2

## 2021-04-29 MED ORDER — VANCOMYCIN HCL 1000 MG IV SOLR
INTRAVENOUS | Status: DC | PRN
  Administered 2021-04-29: 1000 mg via TOPICAL

## 2021-04-29 MED ORDER — METHOCARBAMOL 500 MG PO TABS: 500.0000 mg | ORAL_TABLET | Freq: Three times a day (TID) | ORAL | 0 refills | Status: DC | PRN

## 2021-04-29 MED ORDER — LACTATED RINGERS IV SOLN: INTRAVENOUS | Status: DC

## 2021-04-29 MED ORDER — DEXAMETHASONE SODIUM PHOSPHATE 10 MG/ML IJ SOLN
INTRAMUSCULAR | Status: DC | PRN
  Administered 2021-04-29: 5 mg via INTRAVENOUS

## 2021-04-29 MED ORDER — MIDAZOLAM HCL 2 MG/2ML IJ SOLN
INTRAMUSCULAR | Status: AC
  Administered 2021-04-29: 2 mg via INTRAVENOUS
  Filled 2021-04-29: qty 2

## 2021-04-29 MED ORDER — ROCURONIUM BROMIDE 10 MG/ML (PF) SYRINGE
PREFILLED_SYRINGE | INTRAVENOUS | Status: DC | PRN
  Administered 2021-04-29: 60 mg via INTRAVENOUS

## 2021-04-29 MED ORDER — IRRISEPT - 450ML BOTTLE WITH 0.05% CHG IN STERILE WATER, USP 99.95% OPTIME
TOPICAL | Status: DC | PRN
  Administered 2021-04-29: 450 mL

## 2021-04-29 MED ORDER — PROPOFOL 10 MG/ML IV BOLUS
INTRAVENOUS | Status: DC | PRN
  Administered 2021-04-29: 130 mg via INTRAVENOUS

## 2021-04-29 MED ORDER — FENTANYL CITRATE (PF) 250 MCG/5ML IJ SOLN
INTRAMUSCULAR | Status: DC | PRN
  Administered 2021-04-29 (×3): 50 ug via INTRAVENOUS
  Administered 2021-04-29: 25 ug via INTRAVENOUS

## 2021-04-29 MED ORDER — BUPIVACAINE LIPOSOME 1.3 % IJ SUSP
INTRAMUSCULAR | Status: DC | PRN
  Administered 2021-04-29: 10 mL via PERINEURAL

## 2021-04-29 MED ORDER — FENTANYL CITRATE (PF) 100 MCG/2ML IJ SOLN: 50.0000 ug | Freq: Once | INTRAMUSCULAR | Status: AC

## 2021-04-29 MED ORDER — VANCOMYCIN HCL 1000 MG IV SOLR
INTRAVENOUS | Status: AC
  Filled 2021-04-29: qty 20

## 2021-04-29 MED ORDER — DEXAMETHASONE SODIUM PHOSPHATE 10 MG/ML IJ SOLN
INTRAMUSCULAR | Status: AC
  Filled 2021-04-29: qty 2

## 2021-04-29 MED ORDER — PHENYLEPHRINE 40 MCG/ML (10ML) SYRINGE FOR IV PUSH (FOR BLOOD PRESSURE SUPPORT)
PREFILLED_SYRINGE | INTRAVENOUS | Status: DC | PRN
  Administered 2021-04-29 (×2): 80 ug via INTRAVENOUS
  Administered 2021-04-29: 120 ug via INTRAVENOUS

## 2021-04-29 MED ORDER — TRANEXAMIC ACID-NACL 1000-0.7 MG/100ML-% IV SOLN
INTRAVENOUS | Status: DC | PRN
  Administered 2021-04-29: 1000 mg via INTRAVENOUS

## 2021-04-29 MED ORDER — FENTANYL CITRATE (PF) 250 MCG/5ML IJ SOLN
INTRAMUSCULAR | Status: AC
  Filled 2021-04-29: qty 5

## 2021-04-29 MED ORDER — BUPIVACAINE HCL (PF) 0.5 % IJ SOLN
INTRAMUSCULAR | Status: DC | PRN
  Administered 2021-04-29: 15 mL via PERINEURAL

## 2021-04-29 MED ORDER — MIDAZOLAM HCL 2 MG/2ML IJ SOLN
INTRAMUSCULAR | Status: AC
  Filled 2021-04-29: qty 2

## 2021-04-29 MED ORDER — PROPOFOL 10 MG/ML IV BOLUS
INTRAVENOUS | Status: AC
  Filled 2021-04-29: qty 20

## 2021-04-29 MED ORDER — FENTANYL CITRATE (PF) 100 MCG/2ML IJ SOLN
INTRAMUSCULAR | Status: AC
  Administered 2021-04-29: 50 ug via INTRAVENOUS
  Filled 2021-04-29: qty 2

## 2021-04-29 MED ORDER — MIDAZOLAM HCL 2 MG/2ML IJ SOLN: 2.0000 mg | Freq: Once | INTRAMUSCULAR | Status: AC

## 2021-04-29 MED ORDER — PHENYLEPHRINE 40 MCG/ML (10ML) SYRINGE FOR IV PUSH (FOR BLOOD PRESSURE SUPPORT)
PREFILLED_SYRINGE | INTRAVENOUS | Status: AC
  Filled 2021-04-29: qty 20

## 2021-04-29 MED ORDER — SUGAMMADEX SODIUM 200 MG/2ML IV SOLN
INTRAVENOUS | Status: DC | PRN
  Administered 2021-04-29: 120 mg via INTRAVENOUS

## 2021-04-29 MED ORDER — FENTANYL CITRATE (PF) 100 MCG/2ML IJ SOLN: 25.0000 ug | INTRAMUSCULAR | Status: DC | PRN

## 2021-04-29 MED ORDER — ONDANSETRON HCL 4 MG/2ML IJ SOLN
INTRAMUSCULAR | Status: DC | PRN
  Administered 2021-04-29: 4 mg via INTRAVENOUS

## 2021-04-29 MED ORDER — TRANEXAMIC ACID-NACL 1000-0.7 MG/100ML-% IV SOLN
INTRAVENOUS | Status: AC
  Filled 2021-04-29: qty 100

## 2021-04-29 MED ORDER — ACETAMINOPHEN 500 MG PO TABS
1000.0000 mg | ORAL_TABLET | Freq: Once | ORAL | Status: AC
  Administered 2021-04-29: 1000 mg via ORAL
  Filled 2021-04-29: qty 2

## 2021-04-29 MED ORDER — POVIDONE-IODINE 7.5 % EX SOLN: Freq: Once | CUTANEOUS | Status: DC

## 2021-04-29 MED ORDER — ORAL CARE MOUTH RINSE: 15.0000 mL | Freq: Once | OROMUCOSAL | Status: AC

## 2021-04-29 MED ORDER — CHLORHEXIDINE GLUCONATE 0.12 % MT SOLN
15.0000 mL | Freq: Once | OROMUCOSAL | Status: AC
  Administered 2021-04-29: 15 mL via OROMUCOSAL
  Filled 2021-04-29: qty 15

## 2021-04-29 MED ORDER — CELECOXIB 100 MG PO CAPS: 100.0000 mg | ORAL_CAPSULE | Freq: Two times a day (BID) | ORAL | 0 refills | Status: DC

## 2021-04-29 SURGICAL SUPPLY — 85 items
ALCOHOL 70% 16 OZ (MISCELLANEOUS) ×2 IMPLANT
APL PRP STRL LF DISP 70% ISPRP (MISCELLANEOUS) ×2
APL SKNCLS STERI-STRIP NONHPOA (GAUZE/BANDAGES/DRESSINGS)
BAG COUNTER SPONGE SURGICOUNT (BAG) ×2 IMPLANT
BAG SPNG CNTER NS LX DISP (BAG) ×1
BENZOIN TINCTURE PRP APPL 2/3 (GAUZE/BANDAGES/DRESSINGS) ×1 IMPLANT
BIT DRILL 3.2 (BIT) ×2
BIT DRILL 3.2XCALB NS DISP (BIT) IMPLANT
BIT DRILL CALIBRATED 2.7 (BIT) ×1 IMPLANT
BIT DRL 3.2XCALB NS DISP (BIT) ×1
BNDG COHESIVE 4X5 TAN STRL (GAUZE/BANDAGES/DRESSINGS) ×2 IMPLANT
BNDG ELASTIC 4X5.8 VLCR STR LF (GAUZE/BANDAGES/DRESSINGS) IMPLANT
BNDG ELASTIC 6X5.8 VLCR STR LF (GAUZE/BANDAGES/DRESSINGS) IMPLANT
CHLORAPREP W/TINT 26 (MISCELLANEOUS) ×4 IMPLANT
CNTNR URN SCR LID CUP LEK RST (MISCELLANEOUS) IMPLANT
CONT SPEC 4OZ STRL OR WHT (MISCELLANEOUS) ×2
COVER SURGICAL LIGHT HANDLE (MISCELLANEOUS) ×2 IMPLANT
DRAIN PENROSE 1/2X12 LTX STRL (WOUND CARE) IMPLANT
DRAPE C-ARM 42X72 X-RAY (DRAPES) ×1 IMPLANT
DRAPE IMP U-DRAPE 54X76 (DRAPES) ×2 IMPLANT
DRAPE U-SHAPE 47X51 STRL (DRAPES) ×4 IMPLANT
DRSG AQUACEL AG ADV 3.5X 4 (GAUZE/BANDAGES/DRESSINGS) ×1 IMPLANT
DRSG AQUACEL AG ADV 3.5X 6 (GAUZE/BANDAGES/DRESSINGS) ×1 IMPLANT
DRSG AQUACEL AG ADV 3.5X10 (GAUZE/BANDAGES/DRESSINGS) ×1 IMPLANT
DRSG PAD ABDOMINAL 8X10 ST (GAUZE/BANDAGES/DRESSINGS) IMPLANT
ELECT REM PT RETURN 9FT ADLT (ELECTROSURGICAL) ×2
ELECTRODE REM PT RTRN 9FT ADLT (ELECTROSURGICAL) ×1 IMPLANT
FACESHIELD WRAPAROUND (MASK) IMPLANT
FACESHIELD WRAPAROUND OR TEAM (MASK) ×1 IMPLANT
FIBER STAGRAFT 10CC (Tissue) ×1 IMPLANT
GAUZE SPONGE 4X4 12PLY STRL (GAUZE/BANDAGES/DRESSINGS) ×4 IMPLANT
GAUZE XEROFORM 5X9 LF (GAUZE/BANDAGES/DRESSINGS) IMPLANT
GLOVE SRG 8 PF TXTR STRL LF DI (GLOVE) ×1 IMPLANT
GLOVE SURG LTX SZ8 (GLOVE) ×2 IMPLANT
GLOVE SURG UNDER POLY LF SZ8 (GLOVE) ×2
GOWN STRL REUS W/ TWL LRG LVL3 (GOWN DISPOSABLE) ×2 IMPLANT
GOWN STRL REUS W/ TWL XL LVL3 (GOWN DISPOSABLE) ×1 IMPLANT
GOWN STRL REUS W/TWL LRG LVL3 (GOWN DISPOSABLE) ×4
GOWN STRL REUS W/TWL XL LVL3 (GOWN DISPOSABLE) ×2
HYDROGEN PEROXIDE 16OZ (MISCELLANEOUS) ×2 IMPLANT
JET LAVAGE IRRISEPT WOUND (IRRIGATION / IRRIGATOR) ×2
K-WIRE 2X5 SS THRDED S3 (WIRE) ×2
KIT BASIN OR (CUSTOM PROCEDURE TRAY) ×2 IMPLANT
KIT PLATELET STD 3200 60X6 (KITS) ×1 IMPLANT
KIT TURNOVER KIT B (KITS) ×2 IMPLANT
KWIRE 2X5 SS THRDED S3 (WIRE) IMPLANT
LAVAGE JET IRRISEPT WOUND (IRRIGATION / IRRIGATOR) IMPLANT
MANIFOLD NEPTUNE II (INSTRUMENTS) ×2 IMPLANT
NS IRRIG 1000ML POUR BTL (IV SOLUTION) ×2 IMPLANT
PACK SHOULDER (CUSTOM PROCEDURE TRAY) ×2 IMPLANT
PAD ARMBOARD 7.5X6 YLW CONV (MISCELLANEOUS) ×4 IMPLANT
PAD CAST 4YDX4 CTTN HI CHSV (CAST SUPPLIES) IMPLANT
PADDING CAST COTTON 4X4 STRL (CAST SUPPLIES)
PEG LOCKING 3.2MMX44 (Peg) ×1 IMPLANT
PEG LOCKING 3.2MMX46 (Peg) ×2 IMPLANT
PEG LOCKING 3.2MMX56 (Peg) ×1 IMPLANT
PEG LOCKING 3.2X 60MM (Peg) ×1 IMPLANT
PEG LOCKING 3.2X34 (Screw) ×1 IMPLANT
PEG LOCKING 3.2X40 (Peg) ×1 IMPLANT
PEG LOCKING 3.2X42 (Screw) ×1 IMPLANT
PEG LOCKING 3.2X50 (Screw) ×3 IMPLANT
PLATE PROX HUMERUS HI LT 4H (Plate) ×1 IMPLANT
SCREW LOCK CORT STAR 3.5X24 (Screw) ×2 IMPLANT
SCREW LP NL T15 3.5X24 (Screw) ×1 IMPLANT
SCREW LP NL T15 3.5X26 (Screw) ×1 IMPLANT
SCREW T15 LP CORT 3.5X44MM NS (Screw) ×1 IMPLANT
SLEEVE MEASURING 3.2 (BIT) ×1 IMPLANT
SPONGE T-LAP 18X18 ~~LOC~~+RFID (SPONGE) ×2 IMPLANT
SPONGE T-LAP 4X18 ~~LOC~~+RFID (SPONGE) IMPLANT
STAPLER VISISTAT 35W (STAPLE) IMPLANT
STOCKINETTE IMPERVIOUS 9X36 MD (GAUZE/BANDAGES/DRESSINGS) IMPLANT
STRIP CLOSURE SKIN 1/2X4 (GAUZE/BANDAGES/DRESSINGS) ×1 IMPLANT
SUCTION FRAZIER HANDLE 10FR (MISCELLANEOUS)
SUCTION TUBE FRAZIER 10FR DISP (MISCELLANEOUS) IMPLANT
SUT BROADBAND TAPE 2PK 1.5 (SUTURE) ×2 IMPLANT
SUT ETHILON 3 0 PS 1 (SUTURE) ×1 IMPLANT
SUT MNCRL AB 3-0 PS2 18 (SUTURE) ×1 IMPLANT
SUT VIC AB 1 CT1 27 (SUTURE) ×10
SUT VIC AB 1 CT1 27XBRD ANBCTR (SUTURE) IMPLANT
SUT VIC AB 1 CT1 27XBRD ANTBC (SUTURE) IMPLANT
SUT VIC AB 2-0 CTB1 (SUTURE) ×1 IMPLANT
TOWEL GREEN STERILE (TOWEL DISPOSABLE) ×2 IMPLANT
TOWEL GREEN STERILE FF (TOWEL DISPOSABLE) ×2 IMPLANT
WATER STERILE IRR 1000ML POUR (IV SOLUTION) ×2 IMPLANT
YANKAUER SUCT BULB TIP NO VENT (SUCTIONS) ×1 IMPLANT

## 2021-04-29 NOTE — Brief Op Note (Addendum)
   04/29/2021  12:59 PM  PATIENT:  Rebecca Alvarado  55 y.o. female  PRE-OPERATIVE DIAGNOSIS:  LEFT PROXIMAL HUMERUS NON UNION  POST-OPERATIVE DIAGNOSIS:  LEFT PROXIMAL HUMERUS NON UNION  PROCEDURE:  Procedure(s): LEFT PROXIMAL HUMERUS FRACTURE FIXATION, BONEGRAFTING FROM PELVIS  SURGEON:  Surgeon(s): August Saucer, Corrie Mckusick, MD  ASSISTANT: magnant pa  ANESTHESIA:   general  EBL: 75 ml    Total I/O In: 1000 [I.V.:1000] Out: 200 [Blood:200]  BLOOD ADMINISTERED: none  DRAINS: none   LOCAL MEDICATIONS USED:  vanco  SPECIMEN:  No Specimen  COUNTS:  YES  TOURNIQUET:  * No tourniquets in log *  DICTATION: .Other Dictation: Dictation Number [id number]  PLAN OF CARE: Discharge to home after PACU  PATIENT DISPOSITION:  PACU - hemodynamically stable

## 2021-04-29 NOTE — Anesthesia Procedure Notes (Signed)
Anesthesia Regional Block: Interscalene brachial plexus block   Pre-Anesthetic Checklist: , timeout performed,  Correct Patient, Correct Site, Correct Laterality,  Correct Procedure, Correct Position, site marked,  Risks and benefits discussed,  Surgical consent,  Pre-op evaluation,  At surgeon's request and post-op pain management  Laterality: Left  Prep: Maximum Sterile Barrier Precautions used, chloraprep       Needles:  Injection technique: Single-shot  Needle Type: Echogenic Stimulator Needle     Needle Length: 5cm  Needle Gauge: 22     Additional Needles:   Procedures:,,,, ultrasound used (permanent image in chart),,    Narrative:  Start time: 04/29/2021 8:00 AM End time: 04/29/2021 8:39 AM Injection made incrementally with aspirations every 5 mL.  Performed by: Personally  Anesthesiologist: Elmer Picker, MD  Additional Notes: Monitors applied. No increased pain on injection. No increased resistance to injection. Injection made in 5cc increments. Good needle visualization. Patient tolerated procedure well.

## 2021-04-29 NOTE — Anesthesia Postprocedure Evaluation (Signed)
Anesthesia Post Note  Patient: KYNZIE POLGAR  Procedure(s) Performed: LEFT PROXIMAL HUMERUS FRACTURE FIXATION, BONEGRAFTING FROM PELVIS (Left: Arm Upper)     Patient location during evaluation: PACU Anesthesia Type: Regional and General Level of consciousness: awake and alert Pain management: pain level controlled Vital Signs Assessment: post-procedure vital signs reviewed and stable Respiratory status: spontaneous breathing, nonlabored ventilation, respiratory function stable and patient connected to nasal cannula oxygen Cardiovascular status: blood pressure returned to baseline and stable Postop Assessment: no apparent nausea or vomiting Anesthetic complications: no   No notable events documented.  Last Vitals:  Vitals:   04/29/21 1453 04/29/21 1508  BP: 115/88 116/76  Pulse: 98 (!) 102  Resp: 19 16  Temp:    SpO2: 100% 98%    Last Pain:  Vitals:   04/29/21 1508  TempSrc:   PainSc: 0-No pain                 Ruthanne Mcneish L Donalee Gaumond

## 2021-04-29 NOTE — H&P (Signed)
Rebecca Alvarado is an 55 y.o. female.   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  She sustained left proximal humerus fracture several months ago.  She has since gone on to delayed union/nonunion of the proximal humerus fracture.  Head is viable by MRI scanning.  Minimal to no callus formation is present.  Metabolic work-up negative for vitamin D deficiency or other causes of of nonunion.  Patient is not a smoker.  CT scan shows reasonable alignment of the fracture fragments but there is regions of fibrous tissue preventing bone to bone healing.  She does report weakness and inability to perform hair cutting type activities.  Past Medical History:  Diagnosis Date   Migraine    Migraine without aura, without mention of intractable migraine without mention of status migrainosus 12/30/2013   Renal calculi     Past Surgical History:  Procedure Laterality Date   ELBOW SURGERY Right 08/2014   PARTIAL HYSTERECTOMY     scar tissue removal  1995   leg    Family History  Problem Relation Age of Onset   Neuropathy Father    Migraines Mother    Migraines Daughter    Social History:  reports that she has never smoked. She has never used smokeless tobacco. She reports current alcohol use. She reports that she does not use drugs.  Allergies:  Allergies  Allergen Reactions   Hydrocodone-Acetaminophen Itching    Patient states Vicodin makes her "have severe itching on the inside"   Other     Nuts - migraines    Topamax [Topiramate]     Parethesias, kidney stones    Medications Prior to Admission  Medication Sig Dispense Refill   amitriptyline (ELAVIL) 10 MG tablet Take 1 tablet (10 mg total) by mouth at bedtime. 90 tablet 3   aspirin-acetaminophen-caffeine (EXCEDRIN MIGRAINE) 250-250-65 MG tablet Take 2 tablets by mouth every 6 (six) hours as needed for headache.     bismuth subsalicylate (PEPTO BISMOL) 262 MG chewable tablet Chew 524 mg by  mouth as needed for diarrhea or loose stools or indigestion.     Cal Carb-Mag Hydrox-Simeth (ROLAIDS ADVANCED) 1000-200-40 MG CHEW Chew 1-2 tablets by mouth every 12 (twelve) hours as needed (indigestion).     clindamycin (CLEOCIN T) 1 % SWAB Apply 1 each topically at bedtime.     COLLAGEN PO Take 3 tablets by mouth 2 (two) times daily.     Emollient (ROC RETINOL CORREXION EX) Apply 1 application topically at bedtime.     famotidine (PEPCID) 40 MG tablet Take 40 mg by mouth every evening.     ibuprofen (ADVIL) 200 MG tablet Take 800 mg by mouth 2 (two) times daily as needed for moderate pain.     MINIVELLE 0.1 MG/24HR patch Place 1 patch onto the skin 2 (two) times a week.      OVER THE COUNTER MEDICATION Apply 1 application topically at bedtime. Vitamin C facial cream     Polyethylene Glycol 400 (BLINK TEARS OP) Place 1 drop into both eyes daily as needed (dry eyes).     RELPAX 40 MG tablet TAKE 1 TABLET BY MOUTH AT ONSET OF MIGRAINE. CAN REPEAT IN 2 HOURS IFNEEDED. NOT TO EXCEED 2 TABS IN 24 HOURS (Patient taking differently: Take 40 mg by mouth every 2 (two) hours as needed for migraine.) 10 tablet 11   methocarbamol (ROBAXIN) 500 MG tablet Take 1 tablet (500 mg total) by mouth every  6 (six) hours as needed. (Patient not taking: No sig reported) 40 tablet 1    No results found for this or any previous visit (from the past 48 hour(s)). No results found.  Review of Systems  Musculoskeletal:  Positive for arthralgias.  All other systems reviewed and are negative.  Blood pressure (!) 146/70, pulse 99, temperature 97.7 F (36.5 C), temperature source Oral, resp. rate 17, height 5\' 8"  (1.727 m), weight 105.9 kg, SpO2 100 %. Physical Exam Vitals reviewed.  HENT:     Head: Normocephalic.     Nose: Nose normal.     Mouth/Throat:     Mouth: Mucous membranes are moist.  Eyes:     Pupils: Pupils are equal, round, and reactive to light.  Cardiovascular:     Rate and Rhythm: Normal rate.      Pulses: Normal pulses.  Pulmonary:     Effort: Pulmonary effort is normal.  Abdominal:     General: Abdomen is flat.  Musculoskeletal:     Cervical back: Normal range of motion.  Skin:    General: Skin is warm.     Capillary Refill: Capillary refill takes less than 2 seconds.  Neurological:     General: No focal deficit present.     Mental Status: She is alert.  Psychiatric:        Mood and Affect: Mood normal.  Examination of the left arm demonstrates weakness with forward flexion and abduction both below 90 degrees.  Rotator cuff weakness is also present with infraspinatus and supraspinatus testing but not subscap testing.  Motor sensory function to the hand is intact.  Deltoid does fire.  The fracture does appear to move as a unit with internal and external rotation of the arm.  No coarse grinding is present with that motion.  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  MRI scan shows viability of the humeral head.  Some disuse osteopia is present.  Because of her young age plan at this time is open reduction internal fixation and stabilization of the fracture with plate along with bone grafting using bone marrow aspirate and ostial inductive bone graft.  Risk and benefits of the procedure discussed with the patient including not limited to infection nerve vessel damage incomplete healing as well as potential need for further surgery which would be shoulder replacement.  Patient understands risk and benefits and wishes to proceed.  All questions answered.  , MD 04/29/2021, 8:29 AM

## 2021-04-29 NOTE — Transfer of Care (Signed)
Immediate Anesthesia Transfer of Care Note  Patient: Rebecca Alvarado  Procedure(s) Performed: LEFT PROXIMAL HUMERUS FRACTURE FIXATION, BONEGRAFTING FROM PELVIS (Left: Arm Upper)  Patient Location: PACU  Anesthesia Type:General  Level of Consciousness: drowsy  Airway & Oxygen Therapy: Patient Spontanous Breathing and Patient connected to nasal cannula oxygen  Post-op Assessment: Report given to RN and Post -op Vital signs reviewed and stable  Post vital signs: Reviewed and stable  Last Vitals:  Vitals Value Taken Time  BP 126/78 04/29/21 1340  Temp    Pulse 110 04/29/21 1344  Resp 17 04/29/21 1344  SpO2 98 % 04/29/21 1344  Vitals shown include unvalidated device data.  Last Pain:  Vitals:   04/29/21 1340  TempSrc:   PainSc: 0-No pain      Patients Stated Pain Goal: 2 (04/29/21 0736)  Complications: No notable events documented.

## 2021-04-29 NOTE — Anesthesia Procedure Notes (Signed)
Procedure Name: Intubation Date/Time: 04/29/2021 9:25 AM Performed by: Elliot Dally, CRNA Pre-anesthesia Checklist: Patient identified, Emergency Drugs available, Suction available and Patient being monitored Patient Re-evaluated:Patient Re-evaluated prior to induction Oxygen Delivery Method: Circle System Utilized Preoxygenation: Pre-oxygenation with 100% oxygen Induction Type: IV induction Ventilation: Mask ventilation without difficulty Laryngoscope Size: Miller and 2 Grade View: Grade II Tube type: Oral Tube size: 7.0 mm Number of attempts: 2 Airway Equipment and Method: Stylet and Oral airway Placement Confirmation: ETT inserted through vocal cords under direct vision, positive ETCO2 and breath sounds checked- equal and bilateral Secured at: 22 cm Tube secured with: Tape Dental Injury: Teeth and Oropharynx as per pre-operative assessment  Comments: Grade 2b view. Dlx1, unable to pass ETT. Patient mask ventilated to allow more time for muscle relaxation. Second DL Grade 2b view, ETT passed under direct visualization. Recommend glide scope available for future intubation

## 2021-04-30 DIAGNOSIS — S42292K Other displaced fracture of upper end of left humerus, subsequent encounter for fracture with nonunion: Secondary | ICD-10-CM

## 2021-04-30 NOTE — Op Note (Signed)
Rebecca Alvarado, [facility] MEDICAL RECORD NO: [id number] ACCOUNT NO: [id number] DATE OF BIRTH: Jan 13, 1966 FACILITY: [facility] LOCATION: [facility] PHYSICIAN: Graylin Shiver. August Saucer, MD  Operative Report   DATE OF PROCEDURE: 04/29/2021   PREOPERATIVE DIAGNOSIS:  Left proximal humerus fracture nonunion.  POSTOPERATIVE DIAGNOSIS:  Left proximal humerus fracture nonunion.  PROCEDURE:  Left proximal humerus fracture nonunion takedown with iliac crest bone marrow aspirate and bone grafting and fixation with Biomet proximal humerus plate.  SURGEON:  Graylin Shiver. August Saucer, MD  ASSISTANT:  Karenann Cai, PA.  INDICATIONS:  This is a 55 year old patient who is about 3 months out from left proximal humerus fracture.  She has gone on to nonunion by CT scan.  No avascular necrosis by MRI scanning. Presents now for operative management after failure of  conservative management and attempts to achieve healing.  Metabolic workup was negative for metabolic abnormality to cause this problem.  DESCRIPTION OF PROCEDURE:  The patient was brought to the operating room where general endotracheal anesthesia was induced.  Preoperative IV antibiotics administered.  Timeout was called.  The patient's left hip and left shoulder were pre-scrubbed with  alcohol, Betadine, and allowed to air dry, prepped with ChloraPrep solution and draped in sterile manner.  The patient was placed on the handy bed.  Strap was placed under the axilla to assist with counterforce traction for distraction.  Ioban used to  cover both operative fields.  A timeout was called.  Deltopectoral approach was made.  Skin and subcutaneous tissue sharply divided.  Cephalic vein mobilized laterally.  The patient had fairly adherent tissue within the subacromial region.  Deltoid  elevated manually off its anterior attachment.  Kolbel retractor placed.  Axillary nerve palpated and protected at all times during the case.  The fracture nonunion site was identified.  This area was  opened just lateral to the bicipital groove.  Soft  tissue was removed using both a curette and rongeur.  The canal was opened up in the proximal humerus.  Bleeding bone was present in the humeral head.  Thorough irrigation was performed both with the IrriSept solution after the incision as well as after  the arthrotomy as well as normal saline.  At this time, trocar placed into the anterior superior iliac crest.  60 mL of bone marrow aspirate was obtained and spun down for bone growth stimulating factors which was added to Biomed bone graft.  This bone  graft after mobilization of the fracture ends was performed.  The bone graft was placed and a Biomet plate was then placed in the optimal position.  Screws were placed distally, pegs placed proximally with good fixation and reduction achieved in both the  AP and lateral planes under fluoroscopy.  Thorough irrigation was then performed with 3 liters of irrigating solution.  Vancomycin powder placed over the hardware.  The deltopectoral interval was then closed using #1 Vicryl suture followed by  interrupted inverted 0 Vicryl suture, 2-0 Vicryl suture, and 3-0 Monocryl with Steri-Strips and Aquacel dressing applied.  Shoulder immobilizer applied.  The patient tolerated the procedure well without immediate complication.  Luke's assistance was  required at all times for retraction, opening, closing, mobilization of tissue.  His assistance was a medical necessity.      SUJ D: 04/29/2021 3:25:27 pm T: 04/30/2021 1:45:00 am  JOB: [id number]/ [id number]

## 2021-05-02 ENCOUNTER — Encounter (HOSPITAL_COMMUNITY): Payer: Self-pay | Admitting: Orthopedic Surgery

## 2021-05-04 NOTE — Telephone Encounter (Signed)
Patient has recent diagnosis of nonunion of proximal humerus fracture that necessitated surgical intervention.  She is a healthy individual with no smoking history so the reason for nonunion was unclear.  Prior to surgical fixation of the fracture, multiple lab tests were ordered in order to screen for any potential medical conditions that would contribute to nonunion such as hyperparathyroidism, kidney disease, diabetes, vitamin D deficiency, malnutrition.  These tests were important in order to ensure that patient was medically optimized for recovery from surgery and reduce the chance of repeat nonunion.

## 2021-05-06 ENCOUNTER — Encounter: Payer: Self-pay | Admitting: Surgical

## 2021-05-06 ENCOUNTER — Ambulatory Visit: Payer: Self-pay

## 2021-05-06 ENCOUNTER — Other Ambulatory Visit: Payer: Self-pay

## 2021-05-06 ENCOUNTER — Ambulatory Visit (INDEPENDENT_AMBULATORY_CARE_PROVIDER_SITE_OTHER): Payer: Commercial Managed Care - PPO | Admitting: Surgical

## 2021-05-06 DIAGNOSIS — M25512 Pain in left shoulder: Secondary | ICD-10-CM

## 2021-05-06 DIAGNOSIS — S42292A Other displaced fracture of upper end of left humerus, initial encounter for closed fracture: Secondary | ICD-10-CM

## 2021-05-06 NOTE — Progress Notes (Signed)
Post-Op Visit Note   Patient: Rebecca Alvarado           Date of Birth: [birth year]           MRN: [id number] Visit Date: 05/06/2021 PCP: Dois Davenport, MD   Assessment & Plan:  Chief Complaint:  Chief Complaint  Patient presents with   Left Shoulder - Routine Post Op     04/29/21 (7d)   Left Proximal Humerus Fracture Fixation, Bonegrafting From Pelvis - Left     Visit Diagnoses:  1. Closed 4-part fracture of proximal humerus, left, initial encounter     Plan: Patient is a 55 year old female who presents s/p left proximal humerus fracture fixation with bone grafting of proximal humerus fracture with assistance of bone marrow aspirate from iliac crest on 04/29/2021.  She reports that she is doing well and pain is controlled.  She only had to take pain medication for 2 days postoperatively.  She does not wake with pain.  She states that the constant pain that she had even at rest in her left shoulder has now resolved ever since she woke up from surgery.  She states the arm feels better overall compared with how it did prior to procedure.  She denies any fevers, chills, drainage from the incisions.  No hip pain.  On examination she has incision that looks to be healing well without any surrounding erythema or evidence of dehiscence.  She has small incision overlying the iliac crest on the left side that is also healing well with minimal erythema and no expressible drainage.  Sutures removed and replaced with Steri-Strips from the hip incision.  Axillary nerve intact with deltoid firing and lateral deltoid sensation intact to pinprick sensation.  2+ radial pulse of the operative extremity.  Intact EPL, FPL, wrist extension, finger abduction, finger adduction.  Plan is to continue with immobilization in the sling.  She can come out several times per day to work on elbow range of motion.  Follow-up in 2 weeks for clinical recheck with new radiographs at the time.  Today's radiographs look  excellent with hardware in good position.  Plan to start range of motion exercises at her next appointment in 2 weeks which will mark 3 weeks out from surgery.  Patient agreed with plan.  She will call with any concerns in the meantime.  Follow-Up Instructions: No follow-ups on file.   Orders:  Orders Placed This Encounter  Procedures   XR Shoulder Left   No orders of the defined types were placed in this encounter.   Imaging: No results found.  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, left    Migraine    Migraine without aura 12/30/2013   Past Medical History:  Diagnosis Date   Migraine    Migraine without aura, without mention of intractable migraine without mention of status migrainosus 12/30/2013   Renal calculi     Family History  Problem Relation Age of Onset   Neuropathy Father    Migraines Mother    Migraines Daughter     Past Surgical History:  Procedure Laterality Date   ELBOW SURGERY Right 08/2014   ORIF HUMERUS FRACTURE Left 04/29/2021   Procedure: LEFT PROXIMAL HUMERUS FRACTURE FIXATION, BONEGRAFTING FROM PELVIS;  Surgeon: Cammy Copa, MD;  Location: [facility] OR;  Service: Orthopedics;  Laterality: Left;   PARTIAL HYSTERECTOMY     scar tissue removal  1995   leg   Social History   Occupational History   Occupation: hair stylist  Tobacco Use   Smoking status: Never   Smokeless tobacco: Never  Vaping Use   Vaping Use: Never used  Substance and Sexual Activity   Alcohol use: Yes    Alcohol/week: 0.0 standard drinks    Comment: occasional   Drug use: No   Sexual activity: Not on file

## 2021-05-08 NOTE — Telephone Encounter (Signed)
Does this work?

## 2021-05-09 NOTE — Progress Notes (Deleted)
PATIENT: Rebecca Alvarado DOB: 02/22/1966  REASON FOR VISIT: follow up HISTORY FROM: patient  No chief complaint on file.    HISTORY OF PRESENT ILLNESS: 05/09/21 ALL: Rebecca Alvarado returns for follow up. Botox was approved and appt scheduled 9/29, however, she broke her shoulder and was unable to make appt.   11/08/2020 ALL: Rebecca Alvarado returns for follow up for migraines. She continues magnesium and eletriptan. She was last seen 11/2019 and having 2-3 migraines per month. Over the past few months, headaches have increased to 2-3 per week. She endorses new stressors. Her daughter recently separated from her husband and has a small child. They are now living with Rebecca Alvarado. She knows that weather changes are a trigger for her. Eletriptan usually works but occasionally needing to take two doses.   Patient has tried and failed: Preventative: Topiramate (kidney stones), zonisamide (concerned about memory loss), magnesium Abortive: rizatriptan, eletriptan, metoclopramide   11/03/2019 ALL:  Rebecca Alvarado is a 55 y.o. female here today for follow up for migraines. She continues magnesium daily and Relpax as needed. She may have 2-3 migraines per month easily aborted with Relpax. She has noticed a craving for salty foods with migraines. She is followed regularly by PCP. CPE performed last week and awaiting blood work. No history of anemia or hyponatremia. 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 She is feeling well today and without complaints.   HISTORY: (copied from my note on 11/04/2018)  Rebecca Alvarado is a 55 y.o. female for follow up.  Rebecca Alvarado reports that she is doing well.  She has weaned herself off of Zonegran.  She does continue Relpax for acute management of migraines.  She reports that she rarely had migraines until the last couple of weeks.  She has noted a mild increase in frequency.  She feels that she is fairly stable at this point.     HISTORY (copied from [facility] note on 08/28/2017)    Rebecca Alvarado is a 55 year old female with a history of migraine headaches.  She returns today for follow-up.  She continues on Relpax.  At the last visit her Zonegran dose was reduced to 50 mg daily.  She reports that she never decreased her dose for fear that the headaches come back.  She continues on Zonegran 150 mg daily.  She states that her headaches typically start in the left side of the neck.  She does have photophobia and phonophobia as well as nausea and occasional vomiting.  She states on certain occasions she will have tingling in the left side of the face before the headache begins.  She reports that her headache typically occurs with changes in barometric pressure.  She returns today for evaluation.   HISTORY 08/22/16:  Rebecca Alvarado is a 55 year old right-handed white female with a history of migraine headaches. The patient has done relatively well with her headaches since last seen. The patient has begun exercising, she has been able lose quite a bit of weight, and she has done better with the headaches having on average one a month or so. The patient claims that with Relpax, she can limit the headache within 4 or 5 hours. Sleep will help the headache. On occasion, she may get some left lower face and ear paresthesias unassociated with a visual disturbance, speech disturbance, or weakness. The paresthesias did not go into the body or arm or leg. The patient is interested in trying to lower her dose of the Zonegran. The patient  does occasionally get nauseated, but she does not take medication for this. She returns for an evaluation. She finds that her daughter also has migraine headache.   REVIEW OF SYSTEMS: Out of a complete 14 system review of symptoms, the patient complains only of the following symptoms, migraines and all other reviewed systems are negative.   ALLERGIES: Allergies  Allergen Reactions   Hydrocodone-Acetaminophen Itching    Patient states Vicodin makes her "have severe  itching on the inside"   Other     Nuts - migraines    Topamax [Topiramate]     Parethesias, kidney stones    HOME MEDICATIONS: Outpatient Medications Prior to Visit  Medication Sig Dispense Refill   amitriptyline (ELAVIL) 10 MG tablet Take 1 tablet (10 mg total) by mouth at bedtime. 90 tablet 3   bismuth subsalicylate (PEPTO BISMOL) 262 MG chewable tablet Chew 524 mg by mouth as needed for diarrhea or loose stools or indigestion.     Cal Carb-Mag Hydrox-Simeth (ROLAIDS ADVANCED) 1000-200-40 MG CHEW Chew 1-2 tablets by mouth every 12 (twelve) hours as needed (indigestion).     celecoxib (CELEBREX) 100 MG capsule Take 1 capsule (100 mg total) by mouth 2 (two) times daily. 60 capsule 0   clindamycin (CLEOCIN T) 1 % SWAB Apply 1 each topically at bedtime.     COLLAGEN PO Take 3 tablets by mouth 2 (two) times daily.     Emollient (ROC RETINOL CORREXION EX) Apply 1 application topically at bedtime.     famotidine (PEPCID) 40 MG tablet Take 40 mg by mouth every evening.     methocarbamol (ROBAXIN) 500 MG tablet Take 1 tablet (500 mg total) by mouth every 8 (eight) hours as needed. 30 tablet 0   MINIVELLE 0.1 MG/24HR patch Place 1 patch onto the skin 2 (two) times a week.      OVER THE COUNTER MEDICATION Apply 1 application topically at bedtime. Vitamin C facial cream     oxyCODONE-acetaminophen (PERCOCET) 5-325 MG tablet Take 1 tablet by mouth every 4 (four) hours as needed for severe pain. 30 tablet 0   Polyethylene Glycol 400 (BLINK TEARS OP) Place 1 drop into both eyes daily as needed (dry eyes).     RELPAX 40 MG tablet TAKE 1 TABLET BY MOUTH AT ONSET OF MIGRAINE. CAN REPEAT IN 2 HOURS IFNEEDED. NOT TO EXCEED 2 TABS IN 24 HOURS (Patient taking differently: Take 40 mg by mouth every 2 (two) hours as needed for migraine.) 10 tablet 11   No facility-administered medications prior to visit.    PAST MEDICAL HISTORY: Past Medical History:  Diagnosis Date   Migraine    Migraine without aura,  without mention of intractable migraine without mention of status migrainosus 12/30/2013   Renal calculi     PAST SURGICAL HISTORY: Past Surgical History:  Procedure Laterality Date   ELBOW SURGERY Right 08/2014   ORIF HUMERUS FRACTURE Left 04/29/2021   Procedure: LEFT PROXIMAL HUMERUS FRACTURE FIXATION, BONEGRAFTING FROM PELVIS;  Surgeon: Cammy Copa, MD;  Location: [facility] OR;  Service: Orthopedics;  Laterality: Left;   PARTIAL HYSTERECTOMY     scar tissue removal  1995   leg    FAMILY HISTORY: Family History  Problem Relation Age of Onset   Neuropathy Father    Migraines Mother    Migraines Daughter     SOCIAL HISTORY: Social History   Socioeconomic History   Marital status: Married    Spouse name: Rebecca Alvarado    Number of children: 2  Years of education: college   Highest education level: Not on file  Occupational History   Occupation: hair stylist  Tobacco Use   Smoking status: Never   Smokeless tobacco: Never  Vaping Use   Vaping Use: Never used  Substance and Sexual Activity   Alcohol use: Yes    Alcohol/week: 0.0 standard drinks    Comment: occasional   Drug use: No   Sexual activity: Not on file  Other Topics Concern   Not on file  Social History Narrative   Patient lives at home with husband Rebecca Alvarado.    Patient has 2 children.    Patient has a Trade school education.    Patient is right handed.    Social Determinants of Health   Financial Resource Strain: Not on file  Food Insecurity: Not on file  Transportation Needs: Not on file  Physical Activity: Not on file  Stress: Not on file  Social Connections: Not on file  Intimate Partner Violence: Not on file      PHYSICAL EXAM  There were no vitals filed for this visit.  There is no height or weight on file to calculate BMI.  Generalized: Well developed, in no acute distress  Cardiology: normal rate and rhythm, no murmur noted Respiratory: clear to auscultation bilaterally  Neurological  examination  Mentation: Alert oriented to time, place, history taking. Follows all commands speech and language fluent Cranial nerve II-XII: Pupils were equal round reactive to light. Extraocular movements were full, visual field were full  Motor: The motor testing reveals 5 over 5 strength of all 4 extremities. Good symmetric motor tone is noted throughout.  Gait and station: Gait is normal.   DIAGNOSTIC DATA (LABS, IMAGING, TESTING) - I reviewed patient records, labs, notes, testing and imaging myself where available.  No flowsheet data found.   Lab Results  Component Value Date   WBC 8.9 04/20/2021   HGB 13.6 04/20/2021   HCT 41.5 04/20/2021   MCV 95.0 04/20/2021   PLT 277 04/20/2021      Component Value Date/Time   NA 137 04/29/2021 0705   K 3.7 04/29/2021 0705   CL 107 04/29/2021 0705   CO2 20 (L) 04/29/2021 0705   GLUCOSE 95 04/29/2021 0705   BUN 17 04/29/2021 0705   CREATININE 0.72 04/29/2021 0705   CALCIUM 9.0 04/29/2021 0705   GFRNONAA >60 04/29/2021 0705   No results found for: CHOL, HDL, LDLCALC, LDLDIRECT, TRIG, CHOLHDL Lab Results  Component Value Date   HGBA1C 5.1 04/13/2021   No results found for: VITAMINB12 Lab Results  Component Value Date   TSH 2.37 04/13/2021       ASSESSMENT AND PLAN 55 y.o. year old female  has a past medical history of Migraine, Migraine without aura, without mention of intractable migraine without mention of status migrainosus (12/30/2013), and Renal calculi. here with   No diagnosis found.    Rebecca Alvarado has noted increased frequency of migraines over the past few months. We will start amitriptyline 10mg  daily with plans to increase, as tolerated, to 25mg  daily. She may continue magnesium and eletriptan. She was encouraged to stay well-hydrated and continue to focus on healthy lifestyle habits.  It may be helpful to journal migraines to help identify triggers.  She will continue close follow-up with primary care.  She will  follow-up with me in 6 months, sooner if needed.  She verbalizes understanding and agreement with this plan.   02/22/2021: Patient reports at least 16 headache days per  month with nearly all described as migraines. Will submit to insurance for Botox approval.   No orders of the defined types were placed in this encounter.    No orders of the defined types were placed in this encounter.     Debbora Presto, FNP-C 05/09/2021, 4:16 PM [facility] [address], [place], [place] [address] [phone]

## 2021-05-09 NOTE — Patient Instructions (Incomplete)

## 2021-05-11 ENCOUNTER — Ambulatory Visit: Payer: Commercial Managed Care - PPO | Admitting: Family Medicine

## 2021-05-11 ENCOUNTER — Telehealth: Payer: Self-pay | Admitting: Family Medicine

## 2021-05-11 DIAGNOSIS — G43009 Migraine without aura, not intractable, without status migrainosus: Secondary | ICD-10-CM

## 2021-05-11 NOTE — Telephone Encounter (Signed)
sure

## 2021-05-11 NOTE — Telephone Encounter (Signed)
Appt cancelled due to Amy out sick, LVM & sent mychart msg informing pt.

## 2021-05-20 ENCOUNTER — Other Ambulatory Visit: Payer: Self-pay

## 2021-05-20 ENCOUNTER — Ambulatory Visit (INDEPENDENT_AMBULATORY_CARE_PROVIDER_SITE_OTHER): Payer: Commercial Managed Care - PPO | Admitting: Orthopedic Surgery

## 2021-05-20 ENCOUNTER — Encounter: Payer: Self-pay | Admitting: Orthopedic Surgery

## 2021-05-20 DIAGNOSIS — S42292A Other displaced fracture of upper end of left humerus, initial encounter for closed fracture: Secondary | ICD-10-CM

## 2021-05-20 NOTE — Progress Notes (Signed)
   Post-Op Visit Note   Patient: Rebecca Alvarado           Date of Birth: February 21, 1966           MRN: [id number] Visit Date: 05/20/2021 PCP: Dois Davenport, MD   Assessment & Plan:  Chief Complaint:  Chief Complaint  Patient presents with   Left Shoulder - Routine Post Op    04/29/21 Closed 3-part fracture of proximal humerus with nonunion, left     Visit Diagnoses:  1. Closed 4-part fracture of proximal humerus, left, initial encounter     Plan: Rebecca Alvarado is a 55 year old patient who underwent left proximal humerus fracture fixation with bone grafting 3 weeks ago.  On exam her arm is moving better.  She has better external rotation strength.  Radiographs look good.  Not a lot of fracture consolidation yet at the fracture site.  No loosening of the screws.  At this time we can start her doing some pendulum exercises only for the next 3 weeks and no lifting.  Okay to spend some time out of the sling.  Repeat radiographs in 3 weeks.  Note is provided to the therapist that we do want to go slow with this rehabilitation early on until we get another set of x-rays that demonstrates good fracture consolidation.  Follow-Up Instructions: Return in about 3 weeks (around 06/10/2021).   Orders:  No orders of the defined types were placed in this encounter.  No orders of the defined types were placed in this encounter.   Imaging: No results found.  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, left    Migraine    Migraine without aura 12/30/2013   Past Medical History:  Diagnosis Date   Migraine    Migraine without aura, without mention of intractable migraine without mention of status migrainosus 12/30/2013   Renal calculi     Family History  Problem Relation Age of Onset   Neuropathy Father    Migraines Mother    Migraines Daughter     Past Surgical History:  Procedure Laterality Date   ELBOW  SURGERY Right 08/2014   ORIF HUMERUS FRACTURE Left 04/29/2021   Procedure: LEFT PROXIMAL HUMERUS FRACTURE FIXATION, BONEGRAFTING FROM PELVIS;  Surgeon: Cammy Copa, MD;  Location: [facility] OR;  Service: Orthopedics;  Laterality: Left;   PARTIAL HYSTERECTOMY     scar tissue removal  1995   leg   Social History   Occupational History   Occupation: hair stylist  Tobacco Use   Smoking status: Never   Smokeless tobacco: Never  Vaping Use   Vaping Use: Never used  Substance and Sexual Activity   Alcohol use: Yes    Alcohol/week: 0.0 standard drinks    Comment: occasional   Drug use: No   Sexual activity: Not on file

## 2021-06-09 ENCOUNTER — Ambulatory Visit (INDEPENDENT_AMBULATORY_CARE_PROVIDER_SITE_OTHER): Payer: Commercial Managed Care - PPO | Admitting: Surgical

## 2021-06-09 ENCOUNTER — Ambulatory Visit (INDEPENDENT_AMBULATORY_CARE_PROVIDER_SITE_OTHER): Payer: Commercial Managed Care - PPO

## 2021-06-09 ENCOUNTER — Other Ambulatory Visit: Payer: Self-pay

## 2021-06-09 ENCOUNTER — Encounter: Payer: Self-pay | Admitting: Orthopedic Surgery

## 2021-06-09 DIAGNOSIS — S42202K Unspecified fracture of upper end of left humerus, subsequent encounter for fracture with nonunion: Secondary | ICD-10-CM

## 2021-06-09 DIAGNOSIS — S42292A Other displaced fracture of upper end of left humerus, initial encounter for closed fracture: Secondary | ICD-10-CM

## 2021-06-09 NOTE — Progress Notes (Signed)
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 Closed 4-part fracture of proximal humerus, left, initial encounter   2. 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  She states that she has pretty much no pain unless she rolls over onto her left shoulder at night.  She has discontinued the sling since last visit.  Only uses it when she is going out from her house for extended periods of time.  Denies any radiating pain down the arm.  No neck pain.  No numbness or tingling.  She is currently in physical therapy 1 time per week working with Nida Boatman at [facility] physical therapy where they are focusing on passive range of motion and doing laser therapy as well.  On exam patient has 20 degrees external rotation, 70 degrees abduction, 95 degrees with flexion.  Incision well-healed from prior surgery with no evidence of infection or dehiscence.  Actually nerve intact with deltoid firing.  Intact EPL, wrist extension, FPL, finger abduction, bicep, tricep.  Slight weakness with external rotation but intact 5/5 motor strength of supraspinatus and subscapularis.  Plan is to continue with passive range of motion and pendulums.  No active range of motion yet and no strengthening exercises.  Radiographs reviewed demonstrate some lagging callus formation in the area that was bone grafted.  There is some subtle suggestion of increased bone healing since previous radiographs but nothing  definitive.  There is no lucency surrounding the screws or concern for hardware failure at this time.  Plan is to continue with no lifting with the arm and no lifting the arm under its own power for the next 4 weeks and then follow-up in 4 weeks for clinical recheck with new radiographs at that time.  Encouraging that patient is still having no pain and significant improvement of preoperative pain but want to ensure some callus formation radiographically before progressing her with therapy.  Follow-up in 4 weeks.  Follow-Up Instructions: No follow-ups on file.   Orders:  Orders Placed This Encounter  Procedures   XR Shoulder Left   No orders of the defined types were placed in this encounter.   Imaging: No results found.  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, left    Migraine    Migraine without aura 12/30/2013   Past Medical History:  Diagnosis Date   Migraine    Migraine without aura, without mention of intractable migraine without mention of status migrainosus 12/30/2013   Renal calculi     Family History  Problem Relation Age of Onset   Neuropathy Father    Migraines Mother    Migraines Daughter     Past Surgical History:  Procedure Laterality Date   ELBOW SURGERY Right 08/2014   ORIF HUMERUS FRACTURE Left 04/29/2021   Procedure:  LEFT PROXIMAL HUMERUS FRACTURE FIXATION, BONEGRAFTING FROM PELVIS;  Surgeon: Cammy Copa, MD;  Location: [facility] OR;  Service: Orthopedics;  Laterality: Left;   PARTIAL HYSTERECTOMY     scar tissue removal  1995   leg   Social History   Occupational History   Occupation: hair stylist  Tobacco Use   Smoking status: Never   Smokeless tobacco: Never  Vaping Use   Vaping Use: Never used  Substance and Sexual Activity   Alcohol use: Yes    Alcohol/week: 0.0 standard drinks    Comment: occasional   Drug use: No   Sexual activity: Not on file

## 2021-07-07 ENCOUNTER — Ambulatory Visit: Payer: Commercial Managed Care - PPO | Admitting: Orthopedic Surgery

## 2021-07-07 ENCOUNTER — Other Ambulatory Visit: Payer: Self-pay

## 2021-07-07 ENCOUNTER — Ambulatory Visit (INDEPENDENT_AMBULATORY_CARE_PROVIDER_SITE_OTHER): Payer: Commercial Managed Care - PPO

## 2021-07-07 DIAGNOSIS — S42292A Other displaced fracture of upper end of left humerus, initial encounter for closed fracture: Secondary | ICD-10-CM | POA: Diagnosis not present

## 2021-07-12 ENCOUNTER — Encounter: Payer: Self-pay | Admitting: Orthopedic Surgery

## 2021-07-12 NOTE — Progress Notes (Signed)
° °  Post-Op Visit Note   Patient: Rebecca Alvarado           Date of Birth: [birth year]           MRN: [id number] Visit Date: 07/07/2021 PCP: Dois Davenport, MD   Assessment & Plan:  Chief Complaint:  Chief Complaint  Patient presents with   Left Shoulder - Routine Post Op   Visit Diagnoses:  1. Closed 4-part fracture of proximal humerus, left, initial encounter     Plan: Rebecca Alvarado presents now almost 3 months out left proximal humerus nonunion fixation.  She has been doing well.  Doing exercises at home.  On exam her range of motion is 35/85/120.  Radiographs look good.  Follow-up 4 months for final check with radiographs.  Follow-Up Instructions: Return in about 4 months (around 11/04/2021).   Orders:  Orders Placed This Encounter  Procedures   XR Shoulder Left   No orders of the defined types were placed in this encounter.   Imaging: No results found.  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, left    Migraine    Migraine without aura 12/30/2013   Past Medical History:  Diagnosis Date   Migraine    Migraine without aura, without mention of intractable migraine without mention of status migrainosus 12/30/2013   Renal calculi     Family History  Problem Relation Age of Onset   Neuropathy Father    Migraines Mother    Migraines Daughter     Past Surgical History:  Procedure Laterality Date   ELBOW SURGERY Right 08/2014   ORIF HUMERUS FRACTURE Left 04/29/2021   Procedure: LEFT PROXIMAL HUMERUS FRACTURE FIXATION, BONEGRAFTING FROM PELVIS;  Surgeon: Cammy Copa, MD;  Location: [facility] OR;  Service: Orthopedics;  Laterality: Left;   PARTIAL HYSTERECTOMY     scar tissue removal  1995   leg   Social History   Occupational History   Occupation: hair stylist  Tobacco Use   Smoking status: Never   Smokeless tobacco: Never  Vaping Use   Vaping Use: Never used  Substance and  Sexual Activity   Alcohol use: Yes    Alcohol/week: 0.0 standard drinks    Comment: occasional   Drug use: No   Sexual activity: Not on file

## 2021-08-04 ENCOUNTER — Ambulatory Visit: Payer: Commercial Managed Care - PPO | Admitting: Orthopedic Surgery

## 2021-08-18 ENCOUNTER — Ambulatory Visit: Payer: Commercial Managed Care - PPO | Admitting: Family Medicine

## 2021-08-22 IMAGING — DX DG HUMERUS 2V *L*
3 series · 3 of 3 positions shown · non-contrast
Comparison: None.

CLINICAL DATA: Recent fall with left arm pain, initial encounter

EXAM:
LEFT HUMERUS - 2+ VIEW

[humerus ap]
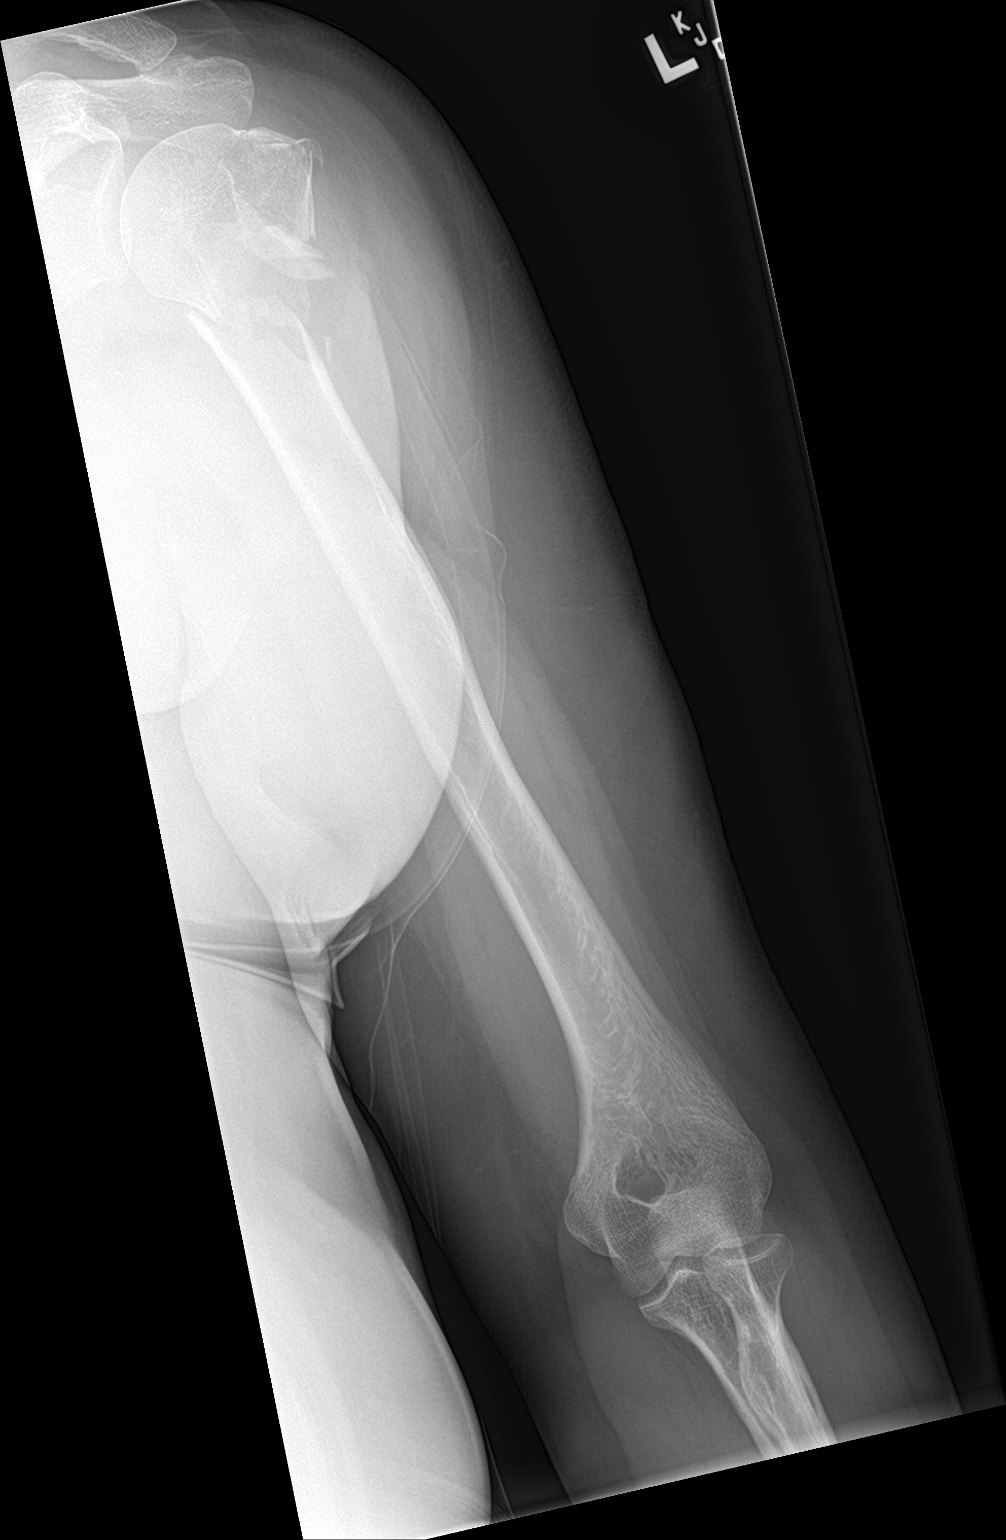

[humerus lat (1 of 2)]
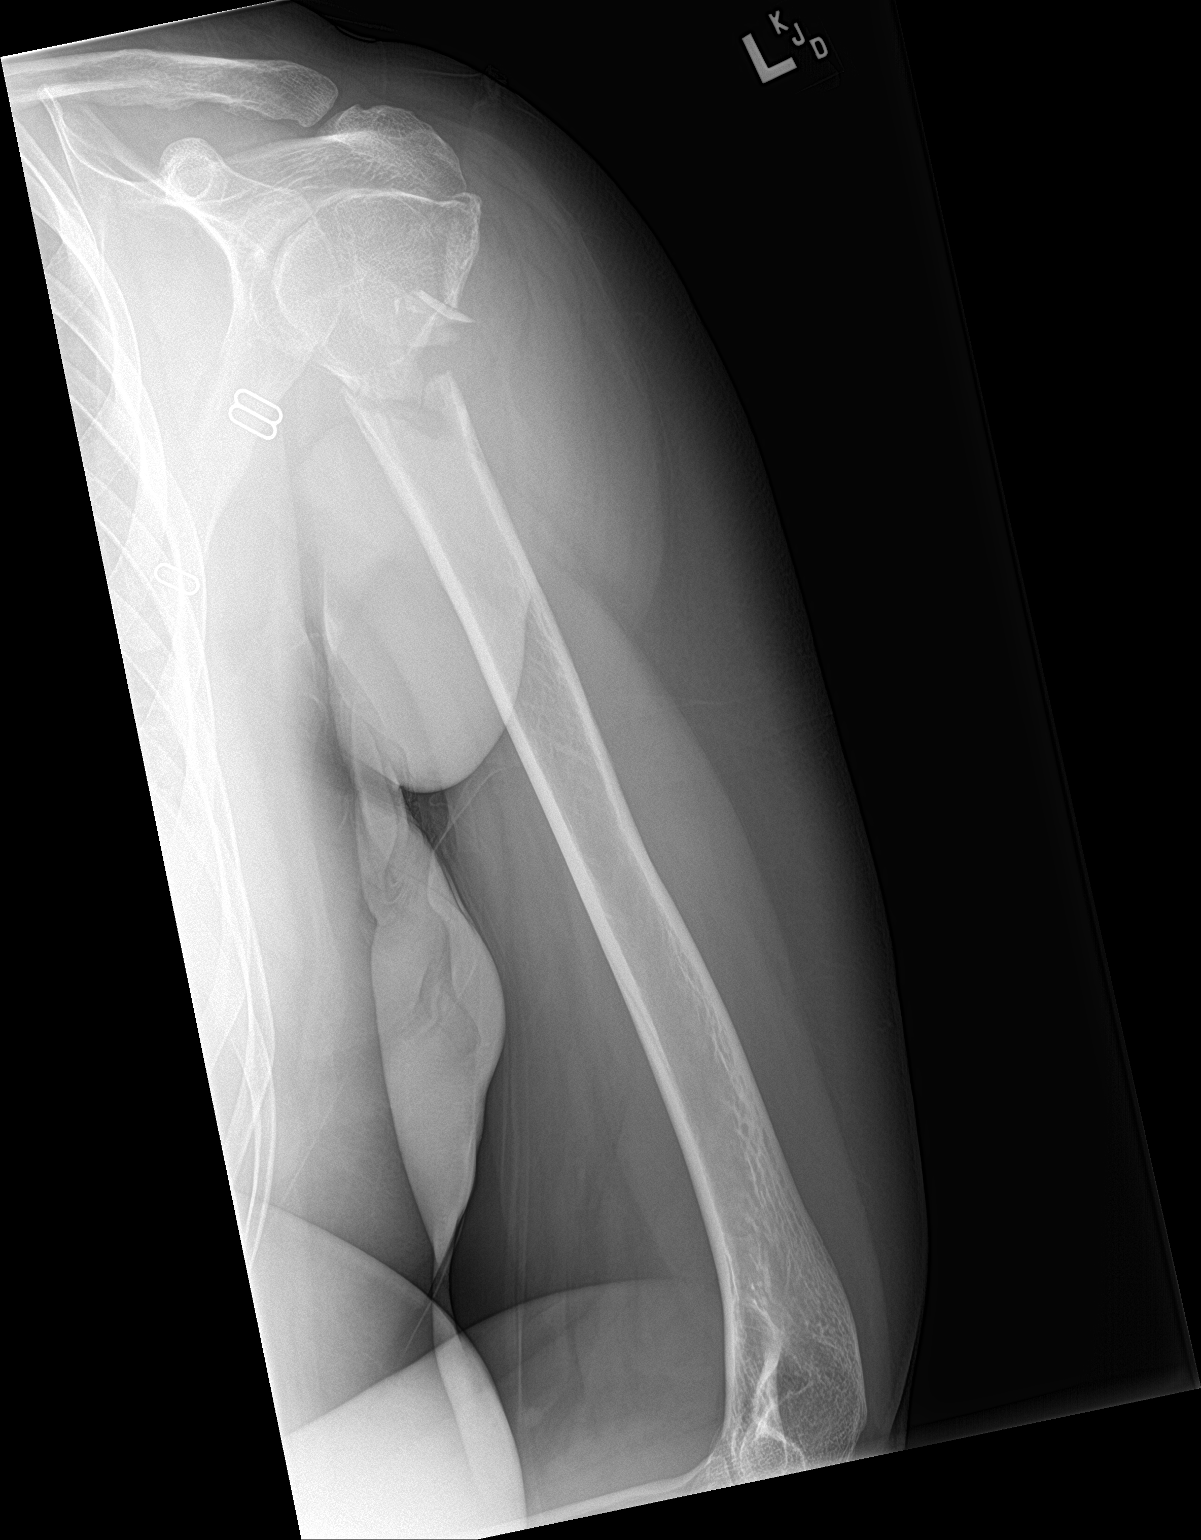

[humerus lat (2 of 2)]
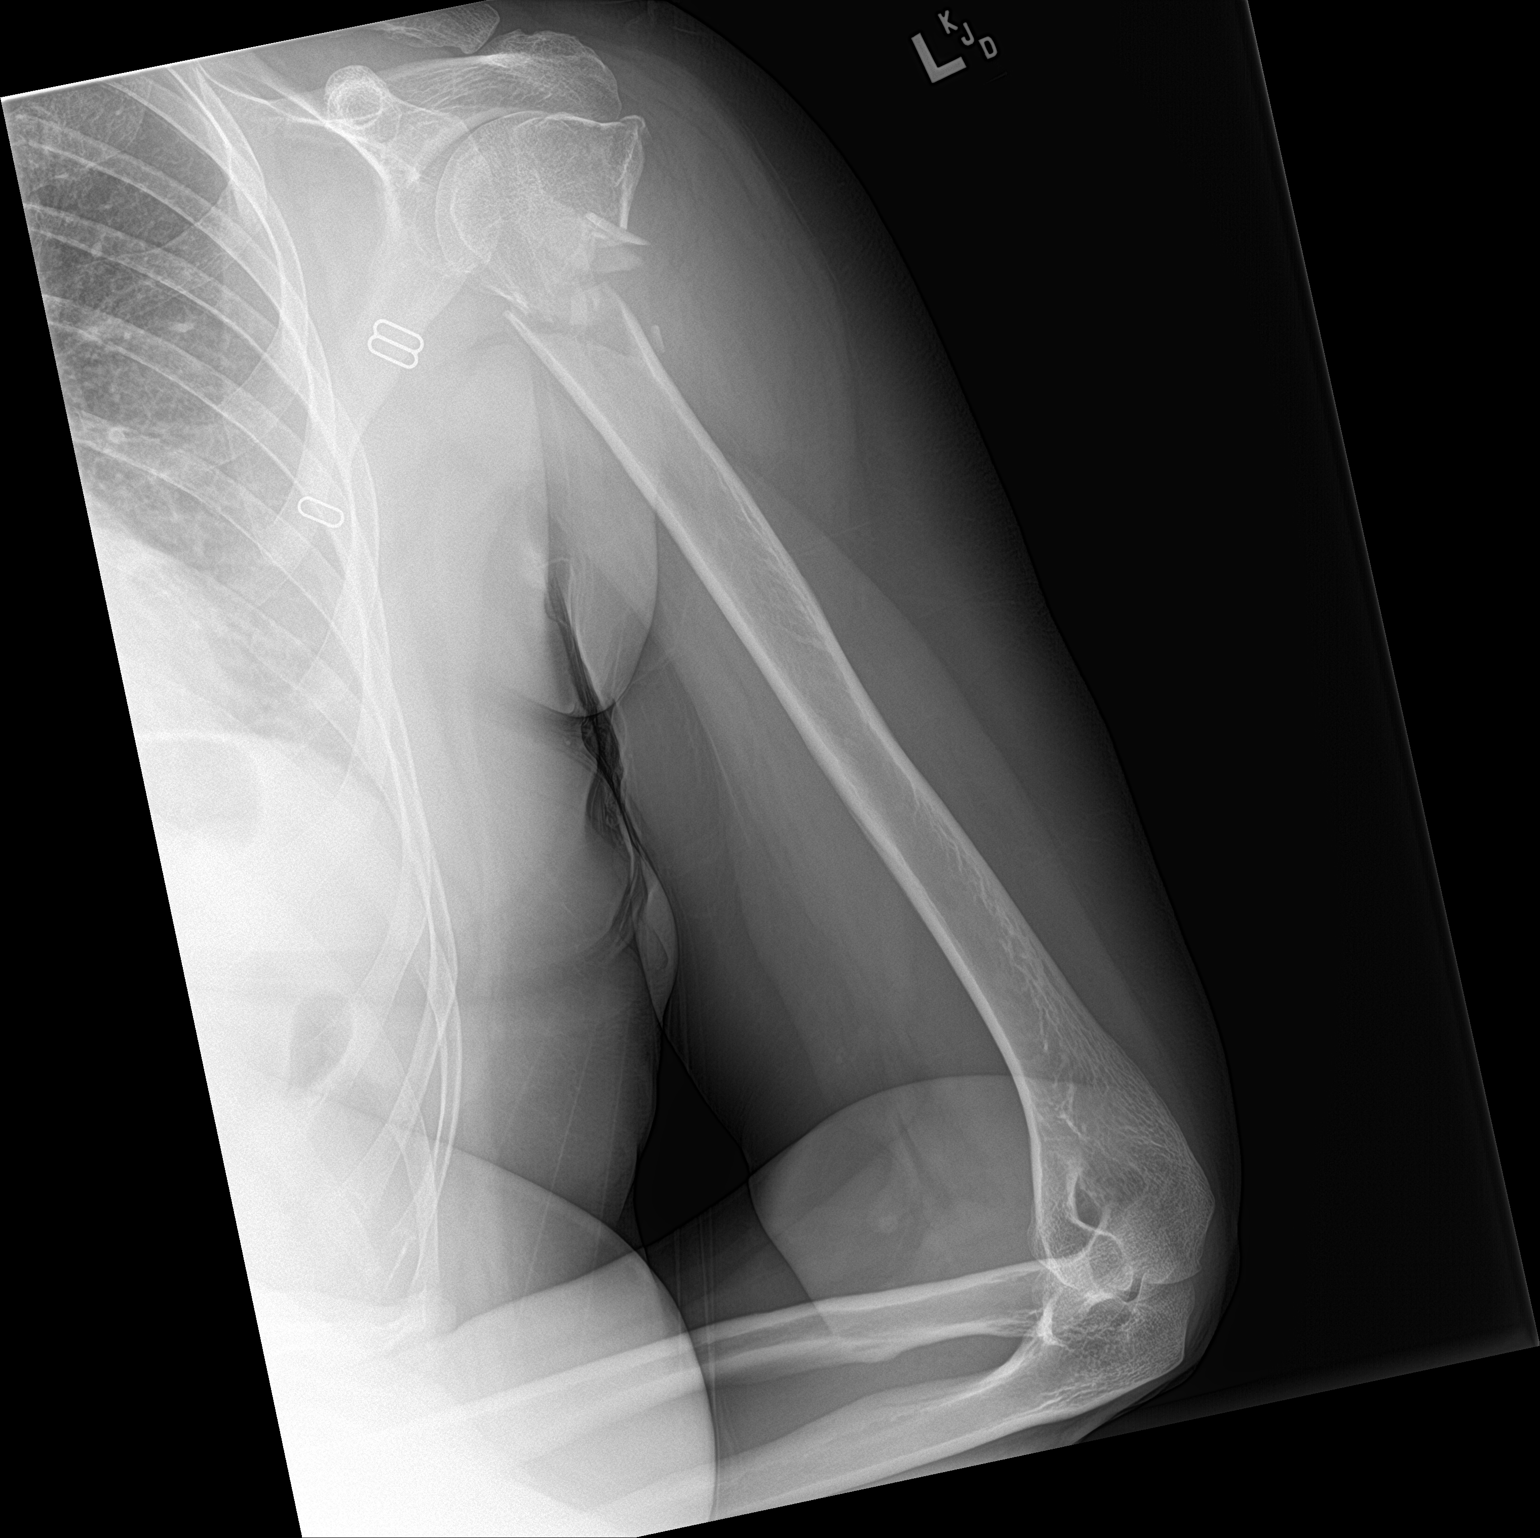

[3 of 3 positions shown; findings below may reference images not displayed]

FINDINGS: Comminuted proximal left humeral fracture is noted involving the
surgical neck and extending into the greater tuberosity. No
significant impaction is noted. No other focal abnormality is noted.
IMPRESSION: Comminuted proximal left humeral fracture as described.

## 2021-10-03 ENCOUNTER — Encounter: Payer: Self-pay | Admitting: Orthopedic Surgery

## 2021-11-02 ENCOUNTER — Ambulatory Visit (INDEPENDENT_AMBULATORY_CARE_PROVIDER_SITE_OTHER): Payer: Commercial Managed Care - PPO

## 2021-11-02 ENCOUNTER — Ambulatory Visit: Payer: Commercial Managed Care - PPO | Admitting: Surgical

## 2021-11-02 ENCOUNTER — Encounter: Payer: Self-pay | Admitting: Orthopedic Surgery

## 2021-11-02 DIAGNOSIS — S42292A Other displaced fracture of upper end of left humerus, initial encounter for closed fracture: Secondary | ICD-10-CM

## 2021-11-02 NOTE — Progress Notes (Signed)
? ?  Post-Op Visit Note ?  ?Patient: Rebecca Alvarado           ?Date of Birth: Nov 06, 1965           ?MRN: [id number] ?Visit Date: 11/02/2021 ?PCP: Dois Davenport, MD ? ? ?Assessment & Plan: ? ?Chief Complaint:  ?Chief Complaint  ?Patient presents with  ? Left Shoulder - Follow-up  ? ?Visit Diagnoses:  ?1. Closed 4-part fracture of proximal humerus, left, initial encounter   ? ? ?Plan: Patient is a 56 year old female who presents s/p proximal humerus ORIF with bone grafting on 04/29/2021.  Doing well overall and has finished physical therapy.  She is doing some strengthening exercises at the gym and at home to work on getting her strength optimized.  Pain is well controlled.  Does not have to take any medications.  She is able to lift a pitcher of water up to shoulder height away from her body with just minimal discomfort.  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  On exam, she has 25 degrees external rotation, 75 degrees abduction, 150 degrees forward flexion.  Incision is well-healed.  Axillary nerve intact with deltoid firing.  Neurovascular intact to the distal extremity with intact EPL, FPL, finger abduction, finger adduction, wrist extension and 2+ radial pulse.  Plan is return in 6 months for clinical recheck with new radiographs at that time to evaluate for avascular necrosis; no evidence of that on today's radiographs and there is increased fracture consolidation compared with prior radiographs.  Patient agreed with plan.  Follow-up in 6 months. ? ?Follow-Up Instructions: No follow-ups on file.  ? ?Orders:  ?Orders Placed This Encounter  ?Procedures  ? XR Shoulder Left  ? ?No orders of the defined types were placed in this encounter. ? ? ?Imaging: ?No results found. ? ?PMFS History: ?Patient Active Problem List  ? Diagnosis Date Noted  ? 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 Migraine   ?  Migraine without aura 12/30/2013  ? ?Past Medical History:  ?Diagnosis Date  ? Migraine   ? Migraine without aura, without mention of intractable migraine without mention of status migrainosus 12/30/2013  ? Renal calculi   ?  ?Family History  ?Problem Relation Age of Onset  ? Neuropathy Father   ? Migraines Mother   ? Migraines Daughter   ?  ?Past Surgical History:  ?Procedure Laterality Date  ? ELBOW SURGERY Right 08/2014  ? ORIF HUMERUS FRACTURE Left 04/29/2021  ? Procedure: LEFT PROXIMAL HUMERUS FRACTURE FIXATION, BONEGRAFTING FROM PELVIS;  Surgeon: Cammy Copa, MD;  Location: [facility] OR;  Service: Orthopedics;  Laterality: Left;  ? PARTIAL HYSTERECTOMY    ? scar tissue removal  1995  ? leg  ? ?Social History  ? ?Occupational History  ? Occupation: hair stylist  ?Tobacco Use  ? Smoking status: Never  ? Smokeless tobacco: Never  ?Vaping Use  ? Vaping Use: Never used  ?Substance and Sexual Activity  ? Alcohol use: Yes  ?  Alcohol/week: 0.0 standard drinks  ?  Comment: occasional  ? Drug use: No  ? Sexual activity: Not on file  ? ? ? ?

## 2021-11-14 ENCOUNTER — Ambulatory Visit: Payer: Commercial Managed Care - PPO | Admitting: Family Medicine

## 2021-11-14 ENCOUNTER — Encounter: Payer: Self-pay | Admitting: Family Medicine

## 2021-11-14 VITALS — BP 149/84 | HR 90 | Ht 68.0 in | Wt 241.5 lb

## 2021-11-14 DIAGNOSIS — G43009 Migraine without aura, not intractable, without status migrainosus: Secondary | ICD-10-CM | POA: Diagnosis not present

## 2021-11-14 MED ORDER — AMITRIPTYLINE HCL 10 MG PO TABS: 20.0000 mg | ORAL_TABLET | Freq: Every day | ORAL | 3 refills | Status: DC

## 2021-11-14 NOTE — Progress Notes (Signed)
? ? ?PATIENT: Rebecca Alvarado ?DOB: Sep 30, 1965 ? ?REASON FOR VISIT: follow up ?HISTORY FROM: patient ? ?Chief Complaint  ?Patient presents with  ? Follow-up  ?  Rm 1, alone. Here for migraine f/u. Pt reports doing well. Pt reports 1 migraine per week. Amiptriptaline not as effective. Has been taking magnesium. Would like to discuss Botox.   ?  ? ?HISTORY OF PRESENT ILLNESS: ? ?11/14/21 ALL: ?Rebecca Alvarado returns for follow up for migraines. She was last seen 10/2020 and started on amitriptyline 10mg  QHS. Relpax continued for abortive therapy. She considered Botox but concerned about cost. Since, she reports having about 8 headache days a month. About 6 of these are migraines. She reports Excedrin and or Relpax usually abort migraine. She averages taking abortive meds 2-3 times a weeks. She injured her left shoulder last year, requiring surgical repair. She feels that she has gained some weight that has contributed to elevated BP.  ? ?11/08/2020 ALL: ?Rebecca Alvarado returns for follow up for migraines. She continues magnesium and eletriptan. She was last seen 11/2019 and having 2-3 migraines per month. Over the past few months, headaches have increased to 2-3 per week. She endorses new stressors. Her daughter recently separated from her husband and has a small child. They are now living with Dandria. She knows that weather changes are a trigger for her. Eletriptan usually works but occasionally needing to take two doses.  ? ?Patient has tried and failed: ?Preventative: Topiramate (kidney stones), zonisamide (concerned about memory loss), magnesium ?Abortive: rizatriptan, eletriptan, metoclopramide  ? ?11/03/2019 ALL:  ?Rebecca Alvarado is a 56 y.o. female here today for follow up for migraines. She continues magnesium daily and Relpax as needed. She may have 2-3 migraines per month easily aborted with Relpax. She has noticed a craving for salty foods with migraines. She is followed regularly by PCP. CPE performed last week and awaiting blood  work. No history of anemia or hyponatremia. She continues to work as a 56. She is feeling well today and without complaints.  ? ?HISTORY: (copied from my note on 11/04/2018) ? ?Rebecca Alvarado is a 56 y.o. female for follow up.  Rebecca Alvarado reports that she is doing well.  She has weaned herself off of Zonegran.  She does continue Relpax for acute management of migraines.  She reports that she rarely had migraines until the last couple of weeks.  She has noted a mild increase in frequency.  She feels that she is fairly stable at this point. ?  ?  ?HISTORY (copied from [facility] note on 08/28/2017) ?  ?Rebecca Alvarado is a 56 year old female with a history of migraine headaches.  She returns today for follow-up.  She continues on Relpax.  At the last visit her Zonegran dose was reduced to 50 mg daily.  She reports that she never decreased her dose for fear that the headaches come back.  She continues on Zonegran 150 mg daily.  She states that her headaches typically start in the left side of the neck.  She does have photophobia and phonophobia as well as nausea and occasional vomiting.  She states on certain occasions she will have tingling in the left side of the face before the headache begins.  She reports that her headache typically occurs with changes in barometric pressure.  She returns today for evaluation. ?  ?HISTORY 08/22/16:  Rebecca Alvarado is a 56 year old right-handed white female with a history of migraine headaches. The patient has done relatively well with  her headaches since last seen. The patient has begun exercising, she has been able lose quite a bit of weight, and she has done better with the headaches having on average one a month or so. The patient claims that with Relpax, she can limit the headache within 4 or 5 hours. Sleep will help the headache. On occasion, she may get some left lower face and ear paresthesias unassociated with a visual disturbance, speech disturbance, or weakness. The  paresthesias did not go into the body or arm or leg. The patient is interested in trying to lower her dose of the Zonegran. The patient does occasionally get nauseated, but she does not take medication for this. She returns for an evaluation. She finds that her daughter also has migraine headache. ? ? ?REVIEW OF SYSTEMS: Out of a complete 14 system review of symptoms, the patient complains only of the following symptoms, migraines and all other reviewed systems are negative. ? ? ?ALLERGIES: ?Allergies  ?Allergen Reactions  ? Hydrocodone-Acetaminophen Itching  ?  Patient states Vicodin makes her "have severe itching on the inside"  ? Other   ?  Nuts - migraines   ? Topamax [Topiramate]   ?  Parethesias, kidney stones  ? ? ?HOME MEDICATIONS: ?Outpatient Medications Prior to Visit  ?Medication Sig Dispense Refill  ? bismuth subsalicylate (PEPTO BISMOL) 262 MG chewable tablet Chew 524 mg by mouth as needed for diarrhea or loose stools or indigestion.    ? COLLAGEN PO Take 3 tablets by mouth 2 (two) times daily.    ? Emollient (ROC RETINOL CORREXION EX) Apply 1 application topically at bedtime.    ? MINIVELLE 0.1 MG/24HR patch Place 1 patch onto the skin 2 (two) times a week.     ? OVER THE COUNTER MEDICATION Apply 1 application topically at bedtime. Vitamin C facial cream    ? pantoprazole (PROTONIX) 40 MG tablet Take 40 mg by mouth daily.    ? Polyethylene Glycol 400 (BLINK TEARS OP) Place 1 drop into both eyes daily as needed (dry eyes).    ? RELPAX 40 MG tablet TAKE 1 TABLET BY MOUTH AT ONSET OF MIGRAINE. CAN REPEAT IN 2 HOURS IFNEEDED. NOT TO EXCEED 2 TABS IN 24 HOURS (Patient taking differently: Take 40 mg by mouth every 2 (two) hours as needed for migraine.) 10 tablet 11  ? amitriptyline (ELAVIL) 10 MG tablet Take 1 tablet (10 mg total) by mouth at bedtime. 90 tablet 3  ? Cal Carb-Mag Hydrox-Simeth (ROLAIDS ADVANCED) 1000-200-40 MG CHEW Chew 1-2 tablets by mouth every 12 (twelve) hours as needed (indigestion).     ? celecoxib (CELEBREX) 100 MG capsule Take 1 capsule (100 mg total) by mouth 2 (two) times daily. 60 capsule 0  ? clindamycin (CLEOCIN T) 1 % SWAB Apply 1 each topically at bedtime.    ? famotidine (PEPCID) 40 MG tablet Take 40 mg by mouth every evening.    ? methocarbamol (ROBAXIN) 500 MG tablet Take 1 tablet (500 mg total) by mouth every 8 (eight) hours as needed. 30 tablet 0  ? oxyCODONE-acetaminophen (PERCOCET) 5-325 MG tablet Take 1 tablet by mouth every 4 (four) hours as needed for severe pain. 30 tablet 0  ? ?No facility-administered medications prior to visit.  ? ? ?PAST MEDICAL HISTORY: ?Past Medical History:  ?Diagnosis Date  ? Migraine   ? Migraine without aura, without mention of intractable migraine without mention of status migrainosus 12/30/2013  ? Renal calculi   ? ? ?PAST SURGICAL HISTORY: ?  Past Surgical History:  ?Procedure Laterality Date  ? ELBOW SURGERY Right 08/2014  ? ORIF HUMERUS FRACTURE Left 04/29/2021  ? Procedure: LEFT PROXIMAL HUMERUS FRACTURE FIXATION, BONEGRAFTING FROM PELVIS;  Surgeon: Cammy Copa, MD;  Location: [facility] OR;  Service: Orthopedics;  Laterality: Left;  ? PARTIAL HYSTERECTOMY    ? scar tissue removal  1995  ? leg  ? ? ?FAMILY HISTORY: ?Family History  ?Problem Relation Age of Onset  ? Neuropathy Father   ? Migraines Mother   ? Migraines Daughter   ? ? ?SOCIAL HISTORY: ?Social History  ? ?Socioeconomic History  ? Marital status: Married  ?  Spouse name: Henreitta Cea   ? Number of children: 2  ? Years of education: college  ? Highest education level: Not on file  ?Occupational History  ? Occupation: hair stylist  ?Tobacco Use  ? Smoking status: Never  ? Smokeless tobacco: Never  ?Vaping Use  ? Vaping Use: Never used  ?Substance and Sexual Activity  ? Alcohol use: Yes  ?  Alcohol/week: 0.0 standard drinks  ?  Comment: occasional  ? Drug use: No  ? Sexual activity: Not on file  ?Other Topics Concern  ? Not on file  ?Social History Narrative  ? Patient lives at home with husband  Henreitta Cea.   ? Patient has 2 children.   ? Patient has a Trade school education.   ? Patient is right handed.   ? ?Social Determinants of Health  ? ?Financial Resource Strain: Not on file  ?Food Insecurity:

## 2021-11-14 NOTE — Patient Instructions (Addendum)
Below is our plan: ? ?We will increase amitriptyline to 20mg  daily at bedtime. Continue Excedrin and Relpax for abortive therapy but avoid taking any abortive meds more than 1-2 times a week. Keep a close eye on your blood pressure. Follow up with PCP if readings remain elevated.  ? ?Consider journaling migraines with Migraine Buddy app or in a notebook.  ? ?Please make sure you are staying well hydrated. I recommend 50-60 ounces daily. Well balanced diet and regular exercise encouraged. Consistent sleep schedule with 6-8 hours recommended.  ? ?Please continue follow up with care team as directed.  ? ?Follow up with me in 6 months.  ? ?You may receive a survey regarding today's visit. I encourage you to leave honest feed back as I do use this information to improve patient care. Thank you for seeing me today!  ? ? ?

## 2021-12-16 ENCOUNTER — Other Ambulatory Visit: Payer: Self-pay | Admitting: Family Medicine

## 2021-12-16 DIAGNOSIS — Z1231 Encounter for screening mammogram for malignant neoplasm of breast: Secondary | ICD-10-CM

## 2022-04-02 HISTORY — PX: SHOULDER SURGERY: SHX246

## 2022-04-20 ENCOUNTER — Other Ambulatory Visit: Payer: Self-pay | Admitting: Neurology

## 2022-04-20 ENCOUNTER — Encounter: Payer: Self-pay | Admitting: Family Medicine

## 2022-04-20 MED ORDER — RELPAX 40 MG PO TABS: ORAL_TABLET | ORAL | 7 refills | Status: DC

## 2022-05-05 ENCOUNTER — Encounter: Payer: Self-pay | Admitting: Orthopedic Surgery

## 2022-05-05 ENCOUNTER — Ambulatory Visit: Payer: Commercial Managed Care - PPO | Admitting: Orthopedic Surgery

## 2022-05-05 ENCOUNTER — Ambulatory Visit (INDEPENDENT_AMBULATORY_CARE_PROVIDER_SITE_OTHER): Payer: Commercial Managed Care - PPO

## 2022-05-05 DIAGNOSIS — S42202K Unspecified fracture of upper end of left humerus, subsequent encounter for fracture with nonunion: Secondary | ICD-10-CM

## 2022-05-05 NOTE — Progress Notes (Signed)
Office Visit Note   Patient: Rebecca Alvarado           Date of Birth: 1966/05/13           MRN: [id number] Visit Date: 05/05/2022 Requested by: Hayden Rasmussen, MD [address] [place],  [place] [id number] PCP: Hayden Rasmussen, MD  Subjective: Chief Complaint  Patient presents with   Left Shoulder - Routine Post Op    HPI: Rebecca Alvarado is a 56 y.o. female who presents to the office reporting for follow-up following left proximal humerus fracture fixation.  Patient has been doing well.  Patient is a Theme park manager.  She has been able to get back to work.  Having minimal to no symptoms with the shoulder.  Not taking much in terms of medication for the problem.              ROS: All systems reviewed are negative as they relate to the chief complaint within the history of present illness.  Patient denies fevers or chills.  Assessment & Plan: Visit Diagnoses:  1. Closed fracture of proximal end of left humerus with nonunion, unspecified fracture morphology, subsequent encounter     Plan: Impression is patient is doing well following left proximal humerus fracture fixation.  She has excellent range of motion and strength.  No evidence of avascular necrosis.  Discussed with her some of the warning signs of developing avascular necrosis which would include coarseness and increasing pain.  She will follow-up as needed.  Follow-Up Instructions: No follow-ups on file.   Orders:  Orders Placed This Encounter  Procedures   XR Shoulder Left   No orders of the defined types were placed in this encounter.     Procedures: No procedures performed   Clinical Data: No additional findings.  Objective: Vital Signs: There were no vitals taken for this visit.  Physical Exam:  Constitutional: Patient appears well-developed HEENT:  Head: Normocephalic Eyes:EOM are normal Neck: Normal range of motion Cardiovascular: Normal rate Pulmonary/chest: Effort normal Neurologic:  Patient is alert Skin: Skin is warm Psychiatric: Patient has normal mood and affect  Ortho Exam: Ortho exam demonstrates range of motion of 45/110/170.  Very good rotator cuff strength infraspinatus supraspinatus subscap muscle testing.  No masses lymphadenopathy or skin changes noted in the shoulder girdle region.  Incision intact.  No coarseness or grinding with passive range of motion.  Specialty Comments:  No specialty comments available.  Imaging: No results found.   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, left    Migraine    Migraine without aura 12/30/2013   Past Medical History:  Diagnosis Date   Migraine    Migraine without aura, without mention of intractable migraine without mention of status migrainosus 12/30/2013   Renal calculi     Family History  Problem Relation Age of Onset   Neuropathy Father    Migraines Mother    Migraines Daughter     Past Surgical History:  Procedure Laterality Date   ELBOW SURGERY Right 08/2014   ORIF HUMERUS FRACTURE Left 04/29/2021   Procedure: LEFT PROXIMAL HUMERUS FRACTURE FIXATION, BONEGRAFTING FROM PELVIS;  Surgeon: Meredith Pel, MD;  Location: [place];  Service: Orthopedics;  Laterality: Left;   PARTIAL HYSTERECTOMY     scar tissue removal  1995   leg   Social History   Occupational History   Occupation: Probation officer  Tobacco Use   Smoking status: Never   Smokeless tobacco: Never  Vaping Use   Vaping Use: Never used  Substance and Sexual Activity   Alcohol use: Yes    Alcohol/week: 0.0 standard drinks of alcohol    Comment: occasional   Drug use: No   Sexual activity: Not on file

## 2022-05-16 ENCOUNTER — Ambulatory Visit: Payer: Commercial Managed Care - PPO | Admitting: Family Medicine

## 2022-07-27 ENCOUNTER — Encounter: Payer: Self-pay | Admitting: Orthopedic Surgery

## 2022-07-31 ENCOUNTER — Encounter: Payer: Self-pay | Admitting: Surgical

## 2022-07-31 ENCOUNTER — Ambulatory Visit (INDEPENDENT_AMBULATORY_CARE_PROVIDER_SITE_OTHER): Payer: Commercial Managed Care - PPO

## 2022-07-31 ENCOUNTER — Ambulatory Visit: Payer: Commercial Managed Care - PPO | Admitting: Surgical

## 2022-07-31 DIAGNOSIS — M25512 Pain in left shoulder: Secondary | ICD-10-CM

## 2022-07-31 DIAGNOSIS — G8929 Other chronic pain: Secondary | ICD-10-CM | POA: Diagnosis not present

## 2022-07-31 NOTE — Progress Notes (Signed)
Office Visit Note   Patient: Rebecca Alvarado           Date of Birth: 1965/10/16           MRN: [id number] Visit Date: 07/31/2022 Requested by: Hayden Rasmussen, MD [place],  [place] [id number] PCP: Hayden Rasmussen, MD  Subjective: Chief Complaint  Patient presents with   Left Shoulder - Pain    HPI: Rebecca Alvarado is a 57 y.o. female who presents to the office reporting left shoulder pain.  States that she has had a little bit of increased pain in her shoulder that she localizes to the lateral aspect of the shoulder with occasional radiation from the elbow to the trapezial region.  She states it began about 2 weeks ago after she had her arm in an AB ducted position for long period of time while she was trying her hair with curlers.  She has had some increased pain since this primarily with crossarm adduction.  When she does this she has pain in the anterior aspect of the shoulder.  When she takes Advil despite gives her 100% relief of her pain.  No significant mechanical symptoms.  No fall or injury.  Does not feel it is worsening over the last several days..                ROS: All systems reviewed are negative as they relate to the chief complaint within the history of present illness.  Patient denies fevers or chills.  Assessment & Plan: Visit Diagnoses:  1. Chronic left shoulder pain   2. Left shoulder pain, unspecified chronicity     Plan: Patient is a 57 year old female who presents for evaluation of left shoulder pain.  She has had a little bit of increased pain since keeping her arm in an abducted position for extended period of time while drying her hair.  She is a little concerned due to her history of prior proximal humerus fracture ORIF that she has messed something up or has early stages of avascular necrosis.  Left shoulder radiographs taken today demonstrate no significant change since prior radiographs from several months ago.  No evidence of  significant hardware failure.  No sign of humeral head collapse.  Plan is to continue with Advil for symptomatic relief.  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  Recommended she reach back out to the office for repeat visit if she has symptoms over the next 1 to 2 months.  Follow-Up Instructions: No follow-ups on file.   Orders:  Orders Placed This Encounter  Procedures   XR Shoulder Left   No orders of the defined types were placed in this encounter.     Procedures: No procedures performed   Clinical Data: No additional findings.  Objective: Vital Signs: There were no vitals taken for this visit.  Physical Exam:  Constitutional: Patient appears well-developed HEENT:  Head: Normocephalic Eyes:EOM are normal Neck: Normal range of motion Cardiovascular: Normal rate Pulmonary/chest: Effort normal Neurologic: Patient is alert Skin: Skin is warm Psychiatric: Patient has normal mood and affect  Ortho Exam: Ortho exam demonstrates left shoulder with 30 degrees X rotation, 100 degrees abduction, 180 degrees forward flexion.  Incision well-healed from prior shoulder surgery.  Axillary nerve intact with deltoid firing.  No significant crepitus noted passive motion of the shoulder.  She has excellent rotator cuff strength of supra, infra, subscap except with 5 -/5 strength  of interest [place].  She has intact EPL, FPL, grip strength testing, pronation/supination, bicep, tricep.  No tenderness over the [facility] joint.  No tenderness over the bicipital groove.  Specialty Comments:  No specialty comments available.  Imaging: No results found.   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, left    Migraine    Migraine without aura 12/30/2013   Past Medical History:  Diagnosis Date   Migraine    Migraine without aura, without mention of intractable migraine without mention of  status migrainosus 12/30/2013   Renal calculi     Family History  Problem Relation Age of Onset   Neuropathy Father    Migraines Mother    Migraines Daughter     Past Surgical History:  Procedure Laterality Date   ELBOW SURGERY Right 08/2014   ORIF HUMERUS FRACTURE Left 04/29/2021   Procedure: LEFT PROXIMAL HUMERUS FRACTURE FIXATION, BONEGRAFTING FROM PELVIS;  Surgeon: Meredith Pel, MD;  Location: [facility];  Service: Orthopedics;  Laterality: Left;   PARTIAL HYSTERECTOMY     scar tissue removal  1995   leg   Social History   Occupational History   Occupation: hair stylist  Tobacco Use   Smoking status: Never   Smokeless tobacco: Never  Vaping Use   Vaping Use: Never used  Substance and Sexual Activity   Alcohol use: Yes    Alcohol/week: 0.0 standard drinks of alcohol    Comment: occasional   Drug use: No   Sexual activity: Not on file

## 2022-09-12 ENCOUNTER — Ambulatory Visit
Admission: RE | Admit: 2022-09-12 | Discharge: 2022-09-12 | Disposition: A | Discharge status: Home / Self Care | Payer: Commercial Managed Care - PPO | Source: Ambulatory Visit | Attending: Family Medicine | Admitting: Family Medicine

## 2022-09-12 DIAGNOSIS — Z1231 Encounter for screening mammogram for malignant neoplasm of breast: Secondary | ICD-10-CM

## 2022-11-07 NOTE — Patient Instructions (Incomplete)
Below is our plan:  We will continue amitriptyline 20mg  at bedtime and Relpax as needed. I will give you some samples of Ubrelvy and Nurtec. Bernita Raisin is dosed like Relpax. Please take 1 tablet at onset of headache. May take 1 additional tablet in 2 hours if needed. Do not take more than 2 tablets in 24 hours or more than 10 in a month. Nurtec is 1 75mg  tablet daily. Do not repeat in 24 hours  You can pain any abortive med with Tylenol 1000mg  AND ibuprofen 800mg  OR Aleve 220mg  or Excedrin AND Benadryl for intractable migraines.   Please make sure you are staying well hydrated. I recommend 50-60 ounces daily. Well balanced diet and regular exercise encouraged. Consistent sleep schedule with 6-8 hours recommended.   Please continue follow up with care team as directed.   Follow up with me in 1 year   You may receive a survey regarding today's visit. I encourage you to leave honest feed back as I do use this information to improve patient care. Thank you for seeing me today!   GENERAL HEADACHE INFORMATION:   Natural supplements: Magnesium Oxide or Magnesium Glycinate 500 mg at bed (up to 800 mg daily) Coenzyme Q10 300 mg in AM Vitamin B2- 200 mg twice a day   Add 1 supplement at a time since even natural supplements can have undesirable side effects. You can sometimes buy supplements cheaper (especially Coenzyme Q10) at www.WebmailGuide.co.za or at [employer].   Vitamins and herbs that show potential:   Magnesium: Magnesium (250 mg twice a day or 500 mg at bed) has a relaxant effect on smooth muscles such as blood vessels. Individuals suffering from frequent or daily headache usually have low magnesium levels which can be increase with daily supplementation of 400-750 mg. Three trials found 40-90% average headache reduction  when used as a preventative. Magnesium also demonstrated the benefit in menstrually related migraine.  Magnesium is part of the messenger system in the serotonin cascade and it is a  good muscle relaxant.  It is also useful for constipation which can be a side effect of other medications used to treat migraine. Good sources include nuts, whole grains, and tomatoes. Side Effects: loose stool/diarrhea  Riboflavin (vitamin B 2) 200 mg twice a day. This vitamin assists nerve cells in the production of ATP a principal energy storing molecule.  It is necessary for many chemical reactions in the body.  There have been at least 3 clinical trials of riboflavin using 400 mg per day all of which suggested that migraine frequency can be decreased.  All 3 trials showed significant improvement in over half of migraine sufferers.  The supplement is found in bread, cereal, milk, meat, and poultry.  Most Americans get more riboflavin than the recommended daily allowance, however riboflavin deficiency is not necessary for the supplements to help prevent headache. Side effects: energizing, green urine   Coenzyme Q10: This is present in almost all cells in the body and is critical component for the conversion of energy.  Recent studies have shown that a nutritional supplement of CoQ10 can reduce the frequency of migraine attacks by improving the energy production of cells as with riboflavin.  Doses of 150 mg twice a day have been shown to be effective.   Melatonin: Increasing evidence shows correlation between melatonin secretion and headache conditions.  Melatonin supplementation has decreased headache intensity and duration.  It is widely used as a sleep aid.  Sleep is natures way of dealing with  migraine.  A dose of 3 mg is recommended to start for headaches including cluster headache. Higher doses up to 15 mg has been reviewed for use in Cluster headache and have been used. The rationale behind using melatonin for cluster is that many theories regarding the cause of Cluster headache center around the disruption of the normal circadian rhythm in the brain.  This helps restore the normal circadian  rhythm.   HEADACHE DIET: Foods and beverages which may trigger migraine Note that only 20% of headache patients are food sensitive. You will know if you are food sensitive if you get a headache consistently 20 minutes to 2 hours after eating a certain food. Only cut out a food if it causes headaches, otherwise you might remove foods you enjoy! What matters most for diet is to eat a well balanced healthy diet full of vegetables and low fat protein, and to not miss meals.   Chocolate, other sweets ALL cheeses except cottage and cream cheese Dairy products, yogurt, sour cream, ice cream Liver Meat extracts (Bovril, Marmite, meat tenderizers) Meats or fish which have undergone aging, fermenting, pickling or smoking. These include: Hotdogs,salami,Lox,sausage, mortadellas,smoked salmon, pepperoni, Pickled herring Pods of broad bean (English beans, Chinese pea pods, [place] (fava) beans, lima and navy beans Ripe avocado, ripe banana Yeast extracts or active yeast preparations such as Brewer's or Fleishman's (commercial bakes goods are permitted) Tomato based foods, pizza (lasagna, etc.)   MSG (monosodium glutamate) is disguised as many things; look for these common aliases: Monopotassium glutamate Autolysed yeast Hydrolysed protein Sodium caseinate "flavorings" "all natural preservatives" Nutrasweet   Avoid all other foods that convincingly provoke headaches.   Resources: The Dizzy [place] Laundry Your Headache Diet, migrainestrong.com  https://zamora-andrews.com/   Caffeine and Migraine For patients that have migraine, caffeine intake more than 3 days per week can lead to dependency and increased migraine frequency. I would recommend cutting back on your caffeine intake as best you can. The recommended amount of caffeine is 200-300 mg daily, although migraine patients may experience dependency at even lower doses. While you may notice an increase in  headache temporarily, cutting back will be helpful for headaches in the long run. For more information on caffeine and migraine, visit: https://americanmigrainefoundation.org/resource-library/caffeine-and-migraine/   Headache Prevention Strategies:   1. Maintain a headache diary; learn to identify and avoid triggers.  - This can be a simple note where you log when you had a headache, associated symptoms, and medications used - There are several smartphone apps developed to help track migraines: Migraine Buddy, Migraine Monitor, Curelator N1-Headache App   Common triggers include: Emotional triggers: Emotional/Upset family or friends Emotional/Upset occupation Business reversal/success Anticipation anxiety Crisis-serious Post-crisis periodNew job/position   Physical triggers: Vacation Day Weekend Strenuous Exercise High Altitude Location New Move Menstrual Day Physical Illness Oversleep/Not enough sleep Weather changes Light: Photophobia or light sesnitivity treatment involves a balance between desensitization and reduction in overly strong input. Use dark polarized glasses outside, but not inside. Avoid bright or fluorescent light, but do not dim environment to the point that going into a normally lit room hurts. Consider FL-41 tint lenses, which reduce the most irritating wavelengths without blocking too much light.  These can be obtained at axonoptics.com or theraspecs.com Foods: see list above.   2. Limit use of acute treatments (over-the-counter medications, triptans, etc.) to no more than 2 days per week or 10 days per month to prevent medication overuse headache (rebound headache).     3. Follow a regular schedule (  including weekends and holidays): Don't skip meals. Eat a balanced diet. 8 hours of sleep nightly. Minimize stress. Exercise 30 minutes per day. Being overweight is associated with a 5 times increased risk of chronic migraine. Keep well hydrated and drink 6-8  glasses of water per day.   4. Initiate non-pharmacologic measures at the earliest onset of your headache. Rest and quiet environment. Relax and reduce stress. Breathe2Relax is a free app that can instruct you on    some simple relaxtion and breathing techniques. Http://Dawnbuse.com is a    free website that provides teaching videos on relaxation.  Also, there are  many apps that   can be downloaded for "mindful" relaxation.  An app called YOGA NIDRA will help walk you through mindfulness. Another app called Calm can be downloaded to give you a structured mindfulness guide with daily reminders and skill development. Headspace for guided meditation Mindfulness Based Stress Reduction Online Course: www.palousemindfulness.com Cold compresses.   5. Don't wait!! Take the maximum allowable dosage of prescribed medication at the first sign of migraine.   6. Compliance:  Take prescribed medication regularly as directed and at the first sign of a migraine.   7. Communicate:  Call your physician when problems arise, especially if your headaches change, increase in frequency/severity, or become associated with neurological symptoms (weakness, numbness, slurred speech, etc.).   8. Headache/pain management therapies: Consider various complementary methods, including medication, behavioral therapy, psychological counselling, biofeedback, massage therapy, acupuncture, dry needling, and other modalities.  Such measures may reduce the need for medications. Counseling for pain management, where patients learn to function and ignore/minimize their pain, seems to work very well.   9. Recommend changing family's attention and focus away from patient's headaches. Instead, emphasize daily activities. If first question of day is 'How are your headaches/Do you have a headache today?', then patient will constantly think about headaches, thus making them worse. Goal is to re-direct attention away from headaches, toward daily  activities and other distractions.   10. Helpful Websites: www.AmericanHeadacheSociety.org PatentHood.ch www.headaches.org TightMarket.nl www.achenet.org

## 2022-11-07 NOTE — Progress Notes (Unsigned)
Rebecca Alvarado: Rebecca Rebecca Alvarado DOB: [birth year]  REASON FOR VISIT: follow up HISTORY FROM: Rebecca Alvarado  No chief complaint on file.    HISTORY OF PRESENT ILLNESS:  11/07/22 ALL: Rebecca Rebecca Alvarado returns for follow up for migraines. We saw her last 10/2021 and increased amitriptyline to 20mg  QHS and Relpax PRN. Since,   11/14/2021 ALL: Rebecca Rebecca Alvarado returns for follow up for migraines. She was last seen 10/2020 and started on amitriptyline 10mg  QHS. Relpax continued for abortive therapy. She considered Botox but concerned about cost. Since, she reports having about 8 headache days a month. About 6 of these are migraines. She reports Excedrin and or Relpax usually abort migraine. She averages taking abortive meds 2-3 times a weeks. She injured her left shoulder last year, requiring surgical repair. She feels that she has gained some weight that has contributed to elevated BP.   11/08/2020 ALL: Rebecca Rebecca Alvarado returns for follow up for migraines. She continues magnesium and eletriptan. She was last seen 11/2019 and having 2-3 migraines per month. Over Rebecca past few months, headaches have increased to 2-3 per week. She endorses new stressors. Her daughter recently separated from her husband and has a small child. They are now living with Rebecca Alvarado. She knows that weather changes are a trigger for her. Eletriptan usually works but occasionally needing to take two doses.   Rebecca Alvarado has tried and failed: Preventative: Topiramate (kidney stones), zonisamide (concerned about memory loss), magnesium Abortive: rizatriptan, eletriptan, metoclopramide   11/03/2019 ALL:  Rebecca Rebecca Alvarado is a 57 y.o. female here today for follow up for migraines. She continues magnesium daily and Relpax as needed. She may have 2-3 migraines per month easily aborted with Relpax. She has noticed a craving for salty foods with migraines. She is followed regularly by PCP. CPE performed last week and awaiting blood work. No history of anemia or hyponatremia. She continues to  work as a Producer, television/film/video. She is feeling well today and without complaints.   HISTORY: (copied from my note on 11/04/2018)  Rebecca Rebecca Alvarado is a 57 y.o. female for follow up.  Rebecca Rebecca Alvarado reports that she is doing well.  She has weaned herself off of Zonegran.  She does continue Relpax for acute management of migraines.  She reports that she rarely had migraines until Rebecca last couple of weeks.  She has noted a mild increase in frequency.  She feels that she is fairly stable at this point.     HISTORY (copied from [facility] note on 08/28/2017)   Rebecca Rebecca Alvarado is a 57 year old female with a history of migraine headaches.  She returns today for follow-up.  She continues on Relpax.  At Rebecca last visit her Zonegran dose was reduced to 50 mg daily.  She reports that she never decreased her dose for fear that Rebecca headaches come back.  She continues on Zonegran 150 mg daily.  She states that her headaches typically start in Rebecca left side of Rebecca neck.  She does have photophobia and phonophobia as well as nausea and occasional vomiting.  She states on certain occasions she will have tingling in Rebecca left side of Rebecca face before Rebecca headache begins.  She reports that her headache typically occurs with changes in barometric pressure.  She returns today for evaluation.   HISTORY 08/22/16:  Rebecca Rebecca Alvarado is a 57 year old right-handed white female with a history of migraine headaches. Rebecca Rebecca Alvarado has done relatively well with her headaches since last seen. Rebecca Rebecca Alvarado has begun exercising, she has been  able lose quite a bit of weight, and she has done better with Rebecca headaches having on average one a month or so. Rebecca Rebecca Alvarado claims that with Relpax, she can limit Rebecca headache within 4 or 5 hours. Sleep will help Rebecca headache. On occasion, she may get some left lower face and ear paresthesias unassociated with a visual disturbance, speech disturbance, or weakness. Rebecca paresthesias did not go into Rebecca body or arm or leg. Rebecca  Rebecca Alvarado is interested in trying to lower her dose of Rebecca Zonegran. Rebecca Rebecca Alvarado does occasionally get nauseated, but she does not take medication for this. She returns for an evaluation. She finds that her daughter also has migraine headache.   REVIEW OF SYSTEMS: Out of a complete 14 system review of symptoms, Rebecca Rebecca Alvarado complains only of Rebecca following symptoms, migraines and all other reviewed systems are negative.   ALLERGIES: Allergies  Allergen Reactions   Hydrocodone-Acetaminophen Itching    Rebecca Alvarado states Vicodin makes her "have severe itching on Rebecca inside"   Other     Nuts - migraines    Topamax [Topiramate]     Parethesias, kidney stones    HOME MEDICATIONS: Outpatient Medications Prior to Visit  Medication Sig Dispense Refill   amitriptyline (ELAVIL) 10 MG tablet Take 2 tablets (20 mg total) by mouth at bedtime. 180 tablet 3   bismuth subsalicylate (PEPTO BISMOL) 262 MG chewable tablet Chew 524 mg by mouth as needed for diarrhea or loose stools or indigestion.     COLLAGEN PO Take 3 tablets by mouth 2 (two) times daily.     Emollient (ROC RETINOL CORREXION EX) Apply 1 application topically at bedtime.     MINIVELLE 0.1 MG/24HR patch Place 1 patch onto Rebecca skin 2 (two) times a week.      OVER Rebecca COUNTER MEDICATION Apply 1 application topically at bedtime. Vitamin C facial cream     pantoprazole (PROTONIX) 40 MG tablet Take 40 mg by mouth daily.     Polyethylene Glycol 400 (BLINK TEARS OP) Place 1 drop into both eyes daily as needed (dry eyes).     RELPAX 40 MG tablet TAKE 1 TABLET BY MOUTH AT ONSET OF MIGRAINE. CAN REPEAT IN 2 HOURS IFNEEDED. NOT TO EXCEED 2 TABS IN 24 HOURS 10 tablet 7   No facility-administered medications prior to visit.    PAST MEDICAL HISTORY: Past Medical History:  Diagnosis Date   Migraine    Migraine without aura, without mention of intractable migraine without mention of status migrainosus 12/30/2013   Renal calculi     PAST SURGICAL  HISTORY: Past Surgical History:  Procedure Laterality Date   ELBOW SURGERY Right 08/2014   ORIF HUMERUS FRACTURE Left 04/29/2021   Procedure: LEFT PROXIMAL HUMERUS FRACTURE FIXATION, BONEGRAFTING FROM PELVIS;  Surgeon: Cammy Copa, MD;  Location: [facility] OR;  Service: Orthopedics;  Laterality: Left;   PARTIAL HYSTERECTOMY     scar tissue removal  1995   leg    FAMILY HISTORY: Family History  Problem Relation Age of Onset   Neuropathy Father    Migraines Mother    Migraines Daughter     SOCIAL HISTORY: Social History   Socioeconomic History   Marital status: Married    Spouse name: Henreitta Cea    Number of children: 2   Years of education: college   Highest education level: Not on file  Occupational History   Occupation: hair stylist  Tobacco Use   Smoking status: Never   Smokeless tobacco: Never  Vaping Use   Vaping Use: Never used  Substance and Sexual Activity   Alcohol use: Yes    Alcohol/week: 0.0 standard drinks of alcohol    Comment: occasional   Drug use: No   Sexual activity: Not on file  Other Topics Concern   Not on file  Social History Narrative   Rebecca Alvarado lives at home with husband Henreitta Cea.    Rebecca Alvarado has 2 children.    Rebecca Alvarado has a Trade school education.    Rebecca Alvarado is right handed.    Social Determinants of Health   Financial Resource Strain: Not on file  Food Insecurity: Not on file  Transportation Needs: Not on file  Physical Activity: Not on file  Stress: Not on file  Social Connections: Not on file  Intimate Partner Violence: Not on file      PHYSICAL EXAM  There were no vitals filed for this visit.   There is no height or weight on file to calculate BMI.  Generalized: Well developed, in no acute distress  Cardiology: normal rate and rhythm, no murmur noted Respiratory: clear to auscultation bilaterally  Neurological examination  Mentation: Alert oriented to time, place, history taking. Follows all commands speech and language  fluent Cranial nerve II-XII: Pupils were equal round reactive to light. Extraocular movements were full, visual field were full  Motor: Rebecca motor testing reveals 5 over 5 strength of all 4 extremities. Good symmetric motor tone is noted throughout.  Gait and station: Gait is normal.   DIAGNOSTIC DATA (LABS, IMAGING, TESTING) - I reviewed Rebecca Alvarado records, labs, notes, testing and imaging myself where available.      No data to display           Lab Results  Component Value Date   WBC 8.9 04/20/2021   HGB 13.6 04/20/2021   HCT 41.5 04/20/2021   MCV 95.0 04/20/2021   PLT 277 04/20/2021      Component Value Date/Time   NA 137 04/29/2021 0705   K 3.7 04/29/2021 0705   CL 107 04/29/2021 0705   CO2 20 (L) 04/29/2021 0705   GLUCOSE 95 04/29/2021 0705   BUN 17 04/29/2021 0705   CREATININE 0.72 04/29/2021 0705   CALCIUM 9.0 04/29/2021 0705   GFRNONAA >60 04/29/2021 0705   No results found for: "CHOL", "HDL", "LDLCALC", "LDLDIRECT", "TRIG", "CHOLHDL" Lab Results  Component Value Date   HGBA1C 5.1 04/13/2021   No results found for: "VITAMINB12" Lab Results  Component Value Date   TSH 2.37 04/13/2021       ASSESSMENT AND PLAN 57 y.o. year old female  has a past medical history of Migraine, Migraine without aura, without mention of intractable migraine without mention of status migrainosus (12/30/2013), and Renal calculi. here with   No diagnosis found.   Vianey has noted a slight decrease in migraine frequency on amitriptyline 10mg  QHS. She has tolerated well. We will increase dose to 20mg  QHS. She may continue magnesium and eletriptan. She was encouraged to stay well-hydrated and continue to focus on healthy lifestyle habits.  It may be helpful to journal migraines to help identify triggers.  She will continue close follow-up with primary care. I have asked her to keep a close eye on BP readings at home and follow up with PCP if they remain > 140/90 consistently. She will  follow-up with me in 6 months, sooner if needed.  She verbalizes understanding and agreement with this plan.   No orders of Rebecca defined types were placed  in this encounter.    No orders of Rebecca defined types were placed in this encounter.     Shawnie Dapper, FNP-C 11/07/2022, 4:36 PM [facility] [address] [place], [place] [address] [phone]

## 2022-11-08 ENCOUNTER — Ambulatory Visit: Payer: Commercial Managed Care - PPO | Admitting: Family Medicine

## 2022-11-08 ENCOUNTER — Encounter: Payer: Self-pay | Admitting: Family Medicine

## 2022-11-08 VITALS — BP 134/88 | HR 107 | Ht 68.0 in | Wt 241.2 lb

## 2022-11-08 DIAGNOSIS — G43009 Migraine without aura, not intractable, without status migrainosus: Secondary | ICD-10-CM

## 2022-11-08 MED ORDER — NURTEC 75 MG PO TBDP: 75.0000 mg | ORAL_TABLET | Freq: Every day | ORAL | 0 refills | Status: DC | PRN

## 2022-11-08 MED ORDER — AMITRIPTYLINE HCL 10 MG PO TABS: 20.0000 mg | ORAL_TABLET | Freq: Every day | ORAL | 3 refills | Status: DC

## 2022-11-08 MED ORDER — UBRELVY 100 MG PO TABS: 100.0000 mg | ORAL_TABLET | Freq: Every day | ORAL | 0 refills | Status: DC | PRN

## 2022-11-08 MED ORDER — RELPAX 40 MG PO TABS: ORAL_TABLET | ORAL | 7 refills | Status: DC

## 2023-01-15 ENCOUNTER — Encounter: Payer: Self-pay | Admitting: Family Medicine

## 2023-01-16 MED ORDER — UBRELVY 100 MG PO TABS: 100.0000 mg | ORAL_TABLET | Freq: Every day | ORAL | 11 refills | Status: DC | PRN

## 2023-04-30 ENCOUNTER — Encounter: Payer: Self-pay | Admitting: Family Medicine

## 2023-07-30 ENCOUNTER — Other Ambulatory Visit: Payer: Self-pay | Admitting: Family Medicine

## 2023-07-30 DIAGNOSIS — Z1231 Encounter for screening mammogram for malignant neoplasm of breast: Secondary | ICD-10-CM

## 2023-09-19 ENCOUNTER — Ambulatory Visit: Payer: Commercial Managed Care - PPO

## 2023-10-09 ENCOUNTER — Ambulatory Visit

## 2023-10-15 ENCOUNTER — Ambulatory Visit
Admission: RE | Admit: 2023-10-15 | Discharge: 2023-10-15 | Disposition: A | Discharge status: Home / Self Care | Source: Ambulatory Visit | Attending: Family Medicine | Admitting: Family Medicine

## 2023-10-15 DIAGNOSIS — Z1231 Encounter for screening mammogram for malignant neoplasm of breast: Secondary | ICD-10-CM

## 2023-10-19 ENCOUNTER — Other Ambulatory Visit: Payer: Self-pay | Admitting: Obstetrics & Gynecology

## 2023-10-19 DIAGNOSIS — R928 Other abnormal and inconclusive findings on diagnostic imaging of breast: Secondary | ICD-10-CM

## 2023-10-24 ENCOUNTER — Ambulatory Visit
Admission: RE | Admit: 2023-10-24 | Discharge: 2023-10-24 | Disposition: A | Discharge status: Home / Self Care | Source: Ambulatory Visit | Attending: Obstetrics & Gynecology | Admitting: Obstetrics & Gynecology

## 2023-10-24 DIAGNOSIS — R928 Other abnormal and inconclusive findings on diagnostic imaging of breast: Secondary | ICD-10-CM

## 2023-10-30 ENCOUNTER — Ambulatory Visit: Admitting: Podiatry

## 2023-10-30 DIAGNOSIS — G5761 Lesion of plantar nerve, right lower limb: Secondary | ICD-10-CM | POA: Diagnosis not present

## 2023-10-30 DIAGNOSIS — Q666 Other congenital valgus deformities of feet: Secondary | ICD-10-CM | POA: Diagnosis not present

## 2023-10-30 NOTE — Progress Notes (Signed)
 Subjective:  Patient ID: Rebecca Alvarado, female    DOB: 12/13/1965,  MRN: [id number]  Chief Complaint  Patient presents with   Toe Pain    Burning sensation with toes on right foot     58 y.o. female presents with the above complaint.  Patient presents with right second interspace pain.  Patient states there is burning shooting pain to the right 2nd and 3rd digit.  She has not seen MRIs prior to seeing me.  She has burning of the toes.  She wears regular shoes.  Pain scale is 5 out of 10 sharp shooting in nature.   Review of Systems: Negative except as noted in the HPI. Denies N/V/F/Ch.  Past Medical History:  Diagnosis Date   Migraine    Migraine without aura, without mention of intractable migraine without mention of status migrainosus 12/30/2013   Renal calculi     Current Outpatient Medications:    amitriptyline  (ELAVIL ) 10 MG tablet, Take 2 tablets (20 mg total) by mouth at bedtime., Disp: 180 tablet, Rfl: 3   bismuth subsalicylate (PEPTO BISMOL) 262 MG chewable tablet, Chew 524 mg by mouth as needed for diarrhea or loose stools or indigestion., Disp: , Rfl:    COLLAGEN PO, Take 3 tablets by mouth 2 (two) times daily., Disp: , Rfl:    Emollient (ROC RETINOL CORREXION EX), Apply 1 application topically at bedtime., Disp: , Rfl:    losartan (COZAAR) 50 MG tablet, Take 1 tablet by mouth daily., Disp: , Rfl:    MINIVELLE 0.1 MG/24HR patch, Place 1 patch onto the skin 2 (two) times a week. , Disp: , Rfl:    OVER THE COUNTER MEDICATION, Apply 1 application topically at bedtime. Vitamin C facial cream, Disp: , Rfl:    pantoprazole (PROTONIX) 40 MG tablet, Take 40 mg by mouth daily., Disp: , Rfl:    Polyethylene Glycol 400 (BLINK TEARS OP), Place 1 drop into both eyes daily as needed (dry eyes)., Disp: , Rfl:    RELPAX  40 MG tablet, TAKE 1 TABLET BY MOUTH AT ONSET OF MIGRAINE. CAN REPEAT IN 2 HOURS IFNEEDED. NOT TO EXCEED 2 TABS IN 24 HOURS, Disp: 10 tablet, Rfl: 7   Ubrogepant   (UBRELVY ) 100 MG TABS, Take 1 tablet (100 mg total) by mouth daily as needed. Take one tablet at onset of headache, may repeat 1 tablet in 2 hours, no more than 2 tablets in 24 hours, Disp: 8 tablet, Rfl: 11  Social History   Tobacco Use  Smoking Status Never  Smokeless Tobacco Never    Allergies  Allergen Reactions   Hydrocodone-Acetaminophen  Itching    Patient states Vicodin makes her "have severe itching on the inside"   Other     Nuts - migraines    Topamax [Topiramate]     Parethesias, kidney stones   Objective:  There were no vitals filed for this visit. There is no height or weight on file to calculate BMI. Constitutional Well developed. Well nourished.  Vascular Dorsalis pedis pulses palpable bilaterally. Posterior tibial pulses palpable bilaterally. Capillary refill normal to all digits.  No cyanosis or clubbing noted. Pedal hair growth normal.  Neurologic Normal speech. Oriented to person, place, and time. Epicritic sensation to light touch grossly present bilaterally.  Dermatologic Nails well groomed and normal in appearance. No open wounds. No skin lesions.  Orthopedic: Gait examination shows the right 2nd and 3rd interspace neuroma positive Mulder's click noted.  Positive lateral squeeze test of the toes noted.  Mild  pain at the 2nd and 3rd metatarsal phalangeal joint pain.  Vira Grieves sign noted.   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 Pes planovalgus    Plan:  Patient was evaluated and treated and all questions answered.  Right second interspace neuroma - All questions and concerns were discussed with the patient in extensive detail given the amount of pain that she is having should benefit from steroid injection to help decrease inflammatory component surgical pain.  Patient agrees with plan like to proceed with steroid injection -A steroid injection was performed at right second interspace using 1% plain  Lidocaine and 10 mg of Kenalog. This was well tolerated.  Pes planovalgus -I explained to patient the etiology of pes planovalgus and relationship with Planter fasciitis and various treatment options were discussed.  Given patient foot structure in the setting of Planter fasciitis I believe patient will benefit from custom-made orthotics to help control the hindfoot motion support the arch of the foot and take the stress away from plantar fascial.  Patient agrees with the plan like to proceed with orthotics -Patient was casted for orthotics

## 2023-11-13 NOTE — Patient Instructions (Incomplete)

## 2023-11-13 NOTE — Progress Notes (Unsigned)
 PATIENT: Rebecca Alvarado DOB: 20-Feb-1966  REASON FOR VISIT: follow up HISTORY FROM: patient  No chief complaint on file.    HISTORY OF PRESENT ILLNESS:  11/13/23 ALL: Rebecca Alvarado returns for follow up for migraines. She was last seen 11/2022 and reported improvement in migraines on amitriptyline  and Relpax , Nurtec and Ubrelvy  sample given. Ubrelvy  seemed to work well and rx called in 01/2023. Since,   11/08/2022 ALL: Rebecca Alvarado returns for follow up for migraines. We saw her last 10/2021 and increased amitriptyline  to 20mg  QHS and Relpax  PRN. Since, she reports doing well. She averages about 4 headache days a month with 2 being migrainous. Relpax  usually works well. She did have an intractable migraine last weekend that lasted 48-72 hours. She aborted with Relpax  x 2 doses daily for 3 days.   Patient has tried and failed: Preventative: Topiramate (kidney stones), zonisamide  (concerned about memory loss), magnesium Abortive: rizatriptan , eletriptan , metoclopramide   11/14/2021 ALL: Rebecca Alvarado returns for follow up for migraines. She was last seen 10/2020 and started on amitriptyline  10mg  QHS. Relpax  continued for abortive therapy. She considered Botox but concerned about cost. Since, she reports having about 8 headache days a month. About 6 of these are migraines. She reports Excedrin and or Relpax  usually abort migraine. She averages taking abortive meds 2-3 times a weeks. She injured her left shoulder last year, requiring surgical repair. She feels that she has gained some weight that has contributed to elevated BP.   11/08/2020 ALL: Rebecca Alvarado returns for follow up for migraines. She continues magnesium and eletriptan . She was last seen 11/2019 and having 2-3 migraines per month. Over the past few months, headaches have increased to 2-3 per week. She endorses new stressors. Her daughter recently separated from her husband and has a small child. They are now living with . She knows that weather changes are a  trigger for her. Eletriptan  usually works but occasionally needing to take two doses.   Patient has tried and failed: Preventative: Topiramate (kidney stones), zonisamide  (concerned about memory loss), magnesium Abortive: rizatriptan , eletriptan , metoclopramide    11/03/2019 ALL:  Rebecca Alvarado is a 58 y.o. female here today for follow up for migraines. She continues magnesium daily and Relpax  as needed. She may have 2-3 migraines per month easily aborted with Relpax . She has noticed a craving for salty foods with migraines. She is followed regularly by PCP. CPE performed last week and awaiting blood work. No history of anemia or hyponatremia. She continues to work as a Producer, television/film/video. She is feeling well today and without complaints.   HISTORY: (copied from my note on 11/04/2018)  Rebecca Alvarado is a 58 y.o. female for follow up.  Rebecca Alvarado reports that she is doing well.  She has weaned herself off of Zonegran .  She does continue Relpax  for acute management of migraines.  She reports that she rarely had migraines until the last couple of weeks.  She has noted a mild increase in frequency.  She feels that she is fairly stable at this point.     HISTORY (copied from [facility] note on 08/28/2017)   Rebecca Alvarado is a 58 year old female with a history of migraine headaches.  She returns today for follow-up.  She continues on Relpax .  At the last visit her Zonegran  dose was reduced to 50 mg daily.  She reports that she never decreased her dose for fear that the headaches come back.  She continues on Zonegran  150 mg daily.  She states that  her headaches typically start in the left side of the neck.  She does have photophobia and phonophobia as well as nausea and occasional vomiting.  She states on certain occasions she will have tingling in the left side of the face before the headache begins.  She reports that her headache typically occurs with changes in barometric pressure.  She returns today for  evaluation.   HISTORY 08/22/16:  Rebecca Alvarado is a 58 year old right-handed white female with a history of migraine headaches. The patient has done relatively well with her headaches since last seen. The patient has begun exercising, she has been able lose quite a bit of weight, and she has done better with the headaches having on average one a month or so. The patient claims that with Relpax , she can limit the headache within 4 or 5 hours. Sleep will help the headache. On occasion, she may get some left lower face and ear paresthesias unassociated with a visual disturbance, speech disturbance, or weakness. The paresthesias did not go into the body or arm or leg. The patient is interested in trying to lower her dose of the Zonegran . The patient does occasionally get nauseated, but she does not take medication for this. She returns for an evaluation. She finds that her daughter also has migraine headache.   REVIEW OF SYSTEMS: Out of a complete 14 system review of symptoms, the patient complains only of the following symptoms, migraines and all other reviewed systems are negative.   ALLERGIES: Allergies  Allergen Reactions   Hydrocodone-Acetaminophen  Itching    Patient states Vicodin makes her "have severe itching on the inside"   Other     Nuts - migraines    Topamax [Topiramate]     Parethesias, kidney stones    HOME MEDICATIONS: Outpatient Medications Prior to Visit  Medication Sig Dispense Refill   amitriptyline  (ELAVIL ) 10 MG tablet Take 2 tablets (20 mg total) by mouth at bedtime. 180 tablet 3   bismuth subsalicylate (PEPTO BISMOL) 262 MG chewable tablet Chew 524 mg by mouth as needed for diarrhea or loose stools or indigestion.     COLLAGEN PO Take 3 tablets by mouth 2 (two) times daily.     Emollient (ROC RETINOL CORREXION EX) Apply 1 application topically at bedtime.     losartan (COZAAR) 50 MG tablet Take 1 tablet by mouth daily.     MINIVELLE 0.1 MG/24HR patch Place 1 patch onto the  skin 2 (two) times a week.      OVER THE COUNTER MEDICATION Apply 1 application topically at bedtime. Vitamin C facial cream     pantoprazole (PROTONIX) 40 MG tablet Take 40 mg by mouth daily.     Polyethylene Glycol 400 (BLINK TEARS OP) Place 1 drop into both eyes daily as needed (dry eyes).     RELPAX  40 MG tablet TAKE 1 TABLET BY MOUTH AT ONSET OF MIGRAINE. CAN REPEAT IN 2 HOURS IFNEEDED. NOT TO EXCEED 2 TABS IN 24 HOURS 10 tablet 7   Ubrogepant  (UBRELVY ) 100 MG TABS Take 1 tablet (100 mg total) by mouth daily as needed. Take one tablet at onset of headache, may repeat 1 tablet in 2 hours, no more than 2 tablets in 24 hours 8 tablet 11   No facility-administered medications prior to visit.    PAST MEDICAL HISTORY: Past Medical History:  Diagnosis Date   Migraine    Migraine without aura, without mention of intractable migraine without mention of status migrainosus 12/30/2013   Renal calculi  PAST SURGICAL HISTORY: Past Surgical History:  Procedure Laterality Date   ELBOW SURGERY Right 08/03/2014   ORIF HUMERUS FRACTURE Left 04/29/2021   Procedure: LEFT PROXIMAL HUMERUS FRACTURE FIXATION, BONEGRAFTING FROM PELVIS;  Surgeon: Jasmine Mesi, MD;  Location: [facility] OR;  Service: Orthopedics;  Laterality: Left;   PARTIAL HYSTERECTOMY     scar tissue removal  07/03/1993   leg   SHOULDER SURGERY Left 04/2022    FAMILY HISTORY: Family History  Problem Relation Age of Onset   Neuropathy Father    Migraines Mother    Migraines Daughter     SOCIAL HISTORY: Social History   Socioeconomic History   Marital status: Married    Spouse name: Lovette Rud    Number of children: 2   Years of education: college   Highest education level: Not on file  Occupational History   Occupation: hair stylist  Tobacco Use   Smoking status: Never   Smokeless tobacco: Never  Vaping Use   Vaping status: Never Used  Substance and Sexual Activity   Alcohol use: Yes    Alcohol/week: 0.0 standard  drinks of alcohol    Comment: occasional   Drug use: No   Sexual activity: Not on file  Other Topics Concern   Not on file  Social History Narrative   Patient lives at home with husband Lovette Rud.    Patient has 2 children.    Patient has a Trade school education.    Patient is right handed.    Social Drivers of Corporate investment banker Strain: Not on file  Food Insecurity: Not on file  Transportation Needs: Not on file  Physical Activity: Not on file  Stress: Not on file  Social Connections: Not on file  Intimate Partner Violence: Not on file      PHYSICAL EXAM  There were no vitals filed for this visit.    There is no height or weight on file to calculate BMI.  Generalized: Well developed, in no acute distress  Cardiology: normal rate and rhythm, no murmur noted Respiratory: clear to auscultation bilaterally  Neurological examination  Mentation: Alert oriented to time, place, history taking. Follows all commands speech and language fluent Cranial nerve II-XII: Pupils were equal round reactive to light. Extraocular movements were full, visual field were full  Motor: The motor testing reveals 5 over 5 strength of all 4 extremities. Good symmetric motor tone is noted throughout.  Gait and station: Gait is normal.   DIAGNOSTIC DATA (LABS, IMAGING, TESTING) - I reviewed patient records, labs, notes, testing and imaging myself where available.      No data to display           Lab Results  Component Value Date   WBC 8.9 04/20/2021   HGB 13.6 04/20/2021   HCT 41.5 04/20/2021   MCV 95.0 04/20/2021   PLT 277 04/20/2021      Component Value Date/Time   NA 137 04/29/2021 0705   K 3.7 04/29/2021 0705   CL 107 04/29/2021 0705   CO2 20 (L) 04/29/2021 0705   GLUCOSE 95 04/29/2021 0705   BUN 17 04/29/2021 0705   CREATININE 0.72 04/29/2021 0705   CALCIUM 9.0 04/29/2021 0705   GFRNONAA >60 04/29/2021 0705   No results found for: "CHOL", "HDL", "LDLCALC",  "LDLDIRECT", "TRIG", "CHOLHDL" Lab Results  Component Value Date   HGBA1C 5.1 04/13/2021   No results found for: "VITAMINB12" Lab Results  Component Value Date   TSH 2.37 04/13/2021  ASSESSMENT AND PLAN 58 y.o. year old female  has a past medical history of Migraine, Migraine without aura, without mention of intractable migraine without mention of status migrainosus (12/30/2013), and Renal calculi. here with   No diagnosis found.   Ledia has noted a 50% decrease in migraine frequency on amitriptyline  20mg  QHS. She has tolerated well. She may continue magnesium and Relpax . Sample of Ubrelvy  and Nurtec provided to try at home. WIll call in if effective. She was encouraged to stay well-hydrated and continue to focus on healthy lifestyle habits.  It may be helpful to journal migraines to help identify triggers.  She will continue close follow-up with primary care. She will follow-up with me in 1 year, sooner if needed.  She verbalizes understanding and agreement with this plan.   No orders of the defined types were placed in this encounter.    No orders of the defined types were placed in this encounter.     Terrilyn Fick, FNP-C 11/13/2023, 9:36 AM [facility] [address] [place], [place] [address] [phone]

## 2023-11-15 ENCOUNTER — Encounter: Payer: Self-pay | Admitting: Family Medicine

## 2023-11-15 ENCOUNTER — Ambulatory Visit: Payer: Commercial Managed Care - PPO | Admitting: Family Medicine

## 2023-11-15 VITALS — BP 123/74 | HR 84 | Ht 68.0 in | Wt 239.0 lb

## 2023-11-15 DIAGNOSIS — G43009 Migraine without aura, not intractable, without status migrainosus: Secondary | ICD-10-CM

## 2023-11-15 MED ORDER — UBRELVY 100 MG PO TABS: 100.0000 mg | ORAL_TABLET | Freq: Every day | ORAL | 11 refills | Status: AC | PRN

## 2023-11-15 MED ORDER — PROPRANOLOL HCL 20 MG PO TABS: 20.0000 mg | ORAL_TABLET | Freq: Two times a day (BID) | ORAL | 1 refills | Status: AC

## 2023-11-15 MED ORDER — RELPAX 40 MG PO TABS: ORAL_TABLET | ORAL | 7 refills | Status: AC

## 2023-11-15 MED ORDER — AMITRIPTYLINE HCL 10 MG PO TABS: 20.0000 mg | ORAL_TABLET | Freq: Every day | ORAL | 3 refills | Status: DC

## 2023-12-04 ENCOUNTER — Ambulatory Visit: Admitting: Podiatry

## 2023-12-11 ENCOUNTER — Ambulatory Visit: Admitting: Podiatry

## 2023-12-11 DIAGNOSIS — G5761 Lesion of plantar nerve, right lower limb: Secondary | ICD-10-CM | POA: Diagnosis not present

## 2023-12-11 DIAGNOSIS — Q666 Other congenital valgus deformities of feet: Secondary | ICD-10-CM

## 2023-12-11 NOTE — Progress Notes (Signed)
 Subjective:  Patient ID: Rebecca Alvarado, female    DOB: 1965-07-06,  MRN: [id number]  Chief Complaint  Patient presents with   Neuroma    58 y.o. female presents with the above complaint.  Patient presents with follow-up of right second interspace neuroma.  Patient states that she is doing better the injection helped considerably.  She would like to discuss next treatment option.  She still has some residual pain.  She is here to pick up her orthotics as well  Review of Systems: Negative except as noted in the HPI. Denies N/V/F/Ch.  Past Medical History:  Diagnosis Date   Migraine    Migraine without aura, without mention of intractable migraine without mention of status migrainosus 12/30/2013   Renal calculi     Current Outpatient Medications:    amitriptyline  (ELAVIL ) 10 MG tablet, Take 2 tablets (20 mg total) by mouth at bedtime., Disp: 180 tablet, Rfl: 3   bismuth subsalicylate (PEPTO BISMOL) 262 MG chewable tablet, Chew 524 mg by mouth as needed for diarrhea or loose stools or indigestion., Disp: , Rfl:    COLLAGEN PO, Take 3 tablets by mouth 2 (two) times daily., Disp: , Rfl:    Emollient (ROC RETINOL CORREXION EX), Apply 1 application topically at bedtime., Disp: , Rfl:    losartan (COZAAR) 50 MG tablet, Take 1 tablet by mouth daily., Disp: , Rfl:    MINIVELLE 0.1 MG/24HR patch, Place 1 patch onto the skin 2 (two) times a week. , Disp: , Rfl:    OVER THE COUNTER MEDICATION, Apply 1 application topically at bedtime. Vitamin C facial cream, Disp: , Rfl:    pantoprazole (PROTONIX) 40 MG tablet, Take 40 mg by mouth daily., Disp: , Rfl:    Polyethylene Glycol 400 (BLINK TEARS OP), Place 1 drop into both eyes daily as needed (dry eyes)., Disp: , Rfl:    propranolol  (INDERAL ) 20 MG tablet, Take 1 tablet (20 mg total) by mouth 2 (two) times daily., Disp: 180 tablet, Rfl: 1   RELPAX  40 MG tablet, TAKE 1 TABLET BY MOUTH AT ONSET OF MIGRAINE. CAN REPEAT IN 2 HOURS IFNEEDED. NOT TO EXCEED  2 TABS IN 24 HOURS, Disp: 10 tablet, Rfl: 7   Ubrogepant  (UBRELVY ) 100 MG TABS, Take 1 tablet (100 mg total) by mouth daily as needed. Take one tablet at onset of headache, may repeat 1 tablet in 2 hours, no more than 2 tablets in 24 hours, Disp: 8 tablet, Rfl: 11  Social History   Tobacco Use  Smoking Status Never  Smokeless Tobacco Never    Allergies  Allergen Reactions   Hydrocodone-Acetaminophen  Itching    Patient states Vicodin makes her have severe itching on the inside   Other     Nuts - migraines    Topamax [Topiramate]     Parethesias, kidney stones   Objective:  There were no vitals filed for this visit. There is no height or weight on file to calculate BMI. Constitutional Well developed. Well nourished.  Vascular Dorsalis pedis pulses palpable bilaterally. Posterior tibial pulses palpable bilaterally. Capillary refill normal to all digits.  No cyanosis or clubbing noted. Pedal hair growth normal.  Neurologic Normal speech. Oriented to person, place, and time. Epicritic sensation to light touch grossly present bilaterally.  Dermatologic Nails well groomed and normal in appearance. No open wounds. No skin lesions.  Orthopedic: Gait examination shows the right 2nd and 3rd interspace neuroma positive Mulder's click noted.  Positive lateral squeeze test of the toes  noted.  Mild pain at the 2nd and 3rd metatarsal phalangeal joint pain.  Vira Grieves sign noted.   Radiographs: None Assessment:   No diagnosis found.  Plan:  Patient was evaluated and treated and all questions answered.  Right second interspace neuroma - All questions and concerns were discussed with the patient in extensive detail given the amount of pain that she is having should benefit from steroid injection to help decrease inflammatory component surgical pain.  Patient agrees with plan like to proceed with steroid injection -A second steroid injection was performed at right second interspace using  1% plain Lidocaine and 10 mg of Kenalog. This was well tolerated.  Pes planovalgus -I explained to patient the etiology of pes planovalgus and relationship with Planter fasciitis and various treatment options were discussed.  Given patient foot structure in the setting of Planter fasciitis I believe patient will benefit from custom-made orthotics to help control the hindfoot motion support the arch of the foot and take the stress away from plantar fascial.  Patient agrees with the plan like to proceed with orthotics - Orthotics dispensed they are functioning well.

## 2024-02-18 ENCOUNTER — Other Ambulatory Visit: Payer: Self-pay | Admitting: *Deleted

## 2024-02-18 NOTE — Telephone Encounter (Signed)
 Last seen on 11/15/23 Follow up scheduled on 11/20/24   Dispensed Days Supply Quantity Provider Pharmacy  PROPRANOLOL  HYDROCHLORIDE  20 MG TABS 02/15/2024 90 180 tablet Lomax, Amy, NP [facility] ...    Too soon to refill

## 2024-03-04 ENCOUNTER — Ambulatory Visit: Admitting: Obstetrics and Gynecology

## 2024-03-04 ENCOUNTER — Other Ambulatory Visit (HOSPITAL_COMMUNITY)
Admission: RE | Admit: 2024-03-04 | Discharge: 2024-03-04 | Disposition: A | Discharge status: Home / Self Care | Source: Other Acute Inpatient Hospital | Attending: Obstetrics and Gynecology | Admitting: Obstetrics and Gynecology

## 2024-03-04 ENCOUNTER — Encounter: Payer: Self-pay | Admitting: Obstetrics and Gynecology

## 2024-03-04 VITALS — BP 132/78 | HR 72 | Ht 66.7 in | Wt 235.8 lb

## 2024-03-04 DIAGNOSIS — R350 Frequency of micturition: Secondary | ICD-10-CM

## 2024-03-04 DIAGNOSIS — R82998 Other abnormal findings in urine: Secondary | ICD-10-CM | POA: Insufficient documentation

## 2024-03-04 DIAGNOSIS — R3129 Other microscopic hematuria: Secondary | ICD-10-CM

## 2024-03-04 DIAGNOSIS — N3946 Mixed incontinence: Secondary | ICD-10-CM | POA: Diagnosis not present

## 2024-03-04 DIAGNOSIS — N952 Postmenopausal atrophic vaginitis: Secondary | ICD-10-CM

## 2024-03-04 DIAGNOSIS — N3281 Overactive bladder: Secondary | ICD-10-CM | POA: Diagnosis not present

## 2024-03-04 DIAGNOSIS — N8110 Cystocele, unspecified: Secondary | ICD-10-CM

## 2024-03-04 LAB — POCT URINALYSIS DIP (CLINITEK)
Bilirubin, UA: NEGATIVE
Glucose, UA: NEGATIVE mg/dL
Ketones, POC UA: NEGATIVE mg/dL
Leukocytes, UA: NEGATIVE
Nitrite, UA: NEGATIVE
POC PROTEIN,UA: NEGATIVE
Spec Grav, UA: 1.02 (ref 1.010–1.025)
Urobilinogen, UA: 0.2 U/dL
pH, UA: 7 (ref 5.0–8.0)

## 2024-03-04 LAB — URINALYSIS, ROUTINE W REFLEX MICROSCOPIC
Bacteria, UA: NONE SEEN
Bilirubin Urine: NEGATIVE
Glucose, UA: NEGATIVE mg/dL
Ketones, ur: NEGATIVE mg/dL
Leukocytes,Ua: NEGATIVE
Nitrite: NEGATIVE
Protein, ur: NEGATIVE mg/dL
Specific Gravity, Urine: 1.014 (ref 1.005–1.030)
pH: 7 (ref 5.0–8.0)

## 2024-03-04 MED ORDER — MIRABEGRON ER 25 MG PO TB24: 25.0000 mg | ORAL_TABLET | Freq: Every day | ORAL | 5 refills | Status: DC

## 2024-03-04 MED ORDER — ESTRADIOL 0.1 MG/GM VA CREA: 0.5000 g | TOPICAL_CREAM | VAGINAL | 11 refills | Status: AC

## 2024-03-04 NOTE — Patient Instructions (Addendum)
 Please use a blueberry sized amount of the estrogen cream nightly for two weeks and then twice a week after.  Start Myrbetriq  25mg  daily and then we can increase if needed or consider another option.   Information on PTNS for you to use your TENS unit in the same region. Typically 30 minutes x2-3 weekly is most helpful. Go to the ankle bone inside the ankle and go up about 3 fingerlengths to place the TENS unit patch  Call if you would like to try a pessary fitting.

## 2024-03-04 NOTE — Progress Notes (Signed)
  Urogynecology New Patient Evaluation and Consultation  Referring Provider: Dannielle Bouchard, DO PCP: Burney Darice CROME, MD Date of Service: 03/04/2024  SUBJECTIVE Chief Complaint: New Patient (Initial Visit) (Rebecca Alvarado is a 58 y.o. female is here for microscopic hematuria - urgency)  History of Present Illness: Rebecca Alvarado is a 58 y.o. White or Caucasian female seen in consultation at the request of Dr. Dannielle for evaluation of Hematuria and urinary frequency.    Review of records significant for: Has tried PT and OAB medications (Possibly Oxybutynin)  Urinary Symptoms: Leaks urine with cough/ sneeze, laughing, exercise, during sex, with a full bladder, and with urgency Leaks 2-3 time(s) per days.  Pad use: 2 liners/ mini-pads per day.   Patient is bothered by UI symptoms.  Day time voids 10.  Nocturia: 2-3 times per night to void. Voiding dysfunction:  empties bladder well.  Patient does not use a catheter to empty bladder.  When urinating, patient feels dribbling after finishing and the need to urinate multiple times in a row Drinks: 6-7 Water  bottles and 1 Diet Dr. Nunzio per day per day  UTIs: 0 UTI's in the last year.   Reports history of blood in urine and kidney or bladder stones No results found for the last 90 days.   Pelvic Organ Prolapse Symptoms:                  Patient Denies a feeling of a bulge the vaginal area.  Bowel Symptom: Bowel movements: 4-5 time(s) per week Stool consistency: hard or soft  Straining: yes.  Splinting: yes.  Incomplete evacuation: no.  Patient Denies accidental bowel leakage / fecal incontinence Bowel regimen: miralax Last colonoscopy: Date 2024, Results polyps removed [facility]          Current Care Gaps     Colonoscopy (Every 10 Years) Never done   No completion history exists for this topic.                 Sexual Function Sexually active: yes.  Sexual orientation: Straight Pain with sex:  Yes, deep in the pelvis  Pelvic Pain Denies pelvic pain    Past Medical History:  Past Medical History:  Diagnosis Date   Migraine    Migraine without aura, without mention of intractable migraine without mention of status migrainosus 12/30/2013   Renal calculi      Past Surgical History:   Past Surgical History:  Procedure Laterality Date   ELBOW SURGERY Right 08/03/2014   ORIF HUMERUS FRACTURE Left 04/29/2021   Procedure: LEFT PROXIMAL HUMERUS FRACTURE FIXATION, BONEGRAFTING FROM PELVIS;  Surgeon: Addie Cordella Hamilton, MD;  Location: [facility] OR;  Service: Orthopedics;  Laterality: Left;   PARTIAL HYSTERECTOMY     scar tissue removal  07/03/1993   leg   SHOULDER SURGERY Left 04/2022     Past OB/GYN History: [id number] Vaginal deliveries: 2,  Forceps/ Vacuum deliveries: 0, Cesarean section: 0 Menopausal: Yes, at age 67 Contraception: Hyst. Last pap smear was 2023.  Any history of abnormal pap smears: no. [facility] PAP   This patient has no relevant Health Maintenance data.     Medications: Patient has a current medication list which includes the following prescription(s): amitriptyline , bismuth subsalicylate, [START ON 03/06/2024] estradiol , losartan, minivelle , mirabegron  er, OVER THE COUNTER MEDICATION, pantoprazole, propranolol , relpax , and ubrelvy .   Allergies: Patient is allergic to hydrocodone-acetaminophen , other, and topamax [topiramate].   Social History:  Social History   Tobacco Use  Smoking status: Never   Smokeless tobacco: Never  Vaping Use   Vaping status: Never Used  Substance Use Topics   Alcohol use: Not Currently    Comment: occasional   Drug use: No    Relationship status: married Patient lives with Husband and daughter.   Patient is employed Scientist, research (medical). Regular exercise: Yes: Walking History of abuse: No  Family History:   Family History  Problem Relation Age of Onset   Migraines Mother    Neuropathy Father    Multiple myeloma Father     Migraines Daughter      Review of Systems: Review of Systems  Constitutional:  Positive for malaise/fatigue. Negative for chills and fever.       +weight gain  Respiratory:  Negative for cough and shortness of breath.   Cardiovascular:  Negative for chest pain and palpitations.  Gastrointestinal:  Negative for abdominal pain, blood in stool, constipation and diarrhea.  Skin:  Negative for rash.  Neurological:  Positive for headaches. Negative for weakness.  Psychiatric/Behavioral:  Negative for depression and suicidal ideas. The patient is nervous/anxious.      OBJECTIVE Physical Exam: Vitals:   03/04/24 1359  BP: 132/78  Pulse: 72  Weight: 235 lb 12.8 oz (107 kg)  Height: 5' 6.7 (1.694 m)    Physical Exam Vitals reviewed. Exam conducted with a chaperone present.  Constitutional:      Appearance: Normal appearance.  Pulmonary:     Effort: Pulmonary effort is normal.  Abdominal:     Palpations: Abdomen is soft.  Neurological:     General: No focal deficit present.     Mental Status: She is alert and oriented to person, place, and time.  Psychiatric:        Mood and Affect: Mood normal.        Behavior: Behavior normal. Behavior is cooperative.        Thought Content: Thought content normal.      GU / Detailed Urogynecologic Evaluation:  Pelvic Exam: Normal external female genitalia; Bartholin's and Skene's glands normal in appearance; urethral meatus normal in appearance, no urethral masses or discharge.   CST: negative  s/p hysterectomy: Speculum exam reveals normal vaginal mucosa with mild atrophy and normal vaginal cuff.  Adnexa normal adnexa.    With apex supported, anterior compartment defect was present  Pelvic floor strength III/V  Pelvic floor musculature: Right levator non-tender, Right obturator tender, Left levator non-tender, Left obturator tender  POP-Q:   POP-Q  -1                                            Aa   -1                                            Ba  -7.5                                              C   3.5  Gh  7                                            Pb  10                                            tvl   -2                                            Ap  -2                                            Bp                                                 D      Rectal Exam:  Normal external exam.   Post-Void Residual (PVR) by Straight Cath    Post Void Residual - 03/04/24 1424       Post Void Residual   Post Void Residual 200 mL           Laboratory Results: Lab Results  Component Value Date   COLORU yellow 03/04/2024   CLARITYU clear 03/04/2024   GLUCOSEUR negative 03/04/2024   BILIRUBINUR negative 03/04/2024   SPECGRAV 1.020 03/04/2024   RBCUR trace-intact (A) 03/04/2024   PHUR 7.0 03/04/2024   PROTEINUR NEGATIVE 08/26/2008   UROBILINOGEN 0.2 03/04/2024   LEUKOCYTESUR Negative 03/04/2024    Lab Results  Component Value Date   CREATININE 0.72 04/29/2021   CREATININE 0.6 08/26/2008    Lab Results  Component Value Date   HGBA1C 5.1 04/13/2021    Lab Results  Component Value Date   HGB 13.6 04/20/2021     ASSESSMENT AND PLAN Ms. Hanback is a 58 y.o. with:  1. OAB (overactive bladder)   2. Urinary frequency   3. Mixed urge and stress incontinence   4. Vaginal atrophy   5. Other microscopic hematuria   6. Leukocytes in urine   7. Prolapse of anterior vaginal wall     We discussed the symptoms of overactive bladder (OAB), which include urinary urgency, urinary frequency, nocturia, with or without urge incontinence.  While we do not know the exact etiology of OAB, several treatment options exist. We discussed management including behavioral therapy (decreasing bladder irritants, urge suppression strategies, timed voids, bladder retraining), physical therapy, medication; for refractory cases posterior tibial nerve stimulation,  sacral neuromodulation, and intravesical botulinum toxin injection. For anticholinergic medications, we discussed the potential side effects of anticholinergics including dry eyes, dry mouth, constipation, cognitive impairment and urinary retention. For Beta-3 agonist medication, we discussed the potential side effect of elevated blood pressure which is more likely to occur in individuals with uncontrolled hypertension. Will start with Myrbetriq  as patient has previously failed anticholinergic medications. Myrbetriq  25mg  sent to pharmacy to start. Can consider increasing if the medication provides some support but  more is needed. Patient may also try TENS unit at home in [place] region.  Patient has both SUI and UUI symptoms. For treatment of her SUI we discussed options including pessary placement, urethral bulking, and surgical sling. She is considering a pessary.  Patient has vaginal atrophy on exam. She would benefit from estrogen cream. Patient to use a blueberry sized amount into the vagina. She may use this nightly for 2 weeks and then twice weekly after. We discussed using her finger instead of using the applicator.  Will send urine for micro and US  renal placed due to hx of blood in urine and kidney stones.  Will send urine for culture.  Patient is mostly asymptomatic of POP, but did have some mildly elevated PVR. Since patient is considering pessary would consider one with support to see if this supports anterior vaginal wall to give support of both minimal prolapse and urethra.   Patient to call and schedule pessary fitting if she would like   Florine Sprenkle G Jonny Dearden, NP

## 2024-03-05 ENCOUNTER — Ambulatory Visit: Payer: Self-pay | Admitting: Obstetrics and Gynecology

## 2024-03-05 DIAGNOSIS — R3129 Other microscopic hematuria: Secondary | ICD-10-CM

## 2024-03-05 LAB — URINE CULTURE: Culture: NO GROWTH

## 2024-03-07 ENCOUNTER — Ambulatory Visit (HOSPITAL_COMMUNITY)
Admission: RE | Admit: 2024-03-07 | Discharge: 2024-03-07 | Disposition: A | Discharge status: Home / Self Care | Source: Ambulatory Visit | Attending: Obstetrics and Gynecology | Admitting: Obstetrics and Gynecology

## 2024-03-07 DIAGNOSIS — R3129 Other microscopic hematuria: Secondary | ICD-10-CM | POA: Diagnosis present

## 2024-03-10 ENCOUNTER — Ambulatory Visit: Admitting: Obstetrics and Gynecology

## 2024-04-11 ENCOUNTER — Encounter: Payer: Self-pay | Admitting: Obstetrics and Gynecology

## 2024-04-22 ENCOUNTER — Ambulatory Visit
Admission: RE | Admit: 2024-04-22 | Discharge: 2024-04-22 | Disposition: A | Source: Ambulatory Visit | Attending: Obstetrics and Gynecology

## 2024-04-22 DIAGNOSIS — R3129 Other microscopic hematuria: Secondary | ICD-10-CM

## 2024-04-22 MED ORDER — IOPAMIDOL (ISOVUE-300) INJECTION 61%
125.0000 mL | Freq: Once | INTRAVENOUS | Status: AC | PRN
Start: 2024-04-22 — End: 2024-04-22
  Administered 2024-04-22: 125 mL via INTRAVENOUS

## 2024-04-25 ENCOUNTER — Ambulatory Visit: Payer: Self-pay | Admitting: Obstetrics and Gynecology

## 2024-05-16 ENCOUNTER — Encounter: Payer: Self-pay | Admitting: Obstetrics and Gynecology

## 2024-05-16 ENCOUNTER — Ambulatory Visit: Admitting: Obstetrics and Gynecology

## 2024-05-16 VITALS — BP 117/89 | HR 98

## 2024-05-16 DIAGNOSIS — N393 Stress incontinence (female) (male): Secondary | ICD-10-CM

## 2024-05-16 DIAGNOSIS — N3281 Overactive bladder: Secondary | ICD-10-CM | POA: Diagnosis not present

## 2024-05-16 DIAGNOSIS — N8110 Cystocele, unspecified: Secondary | ICD-10-CM

## 2024-05-16 NOTE — Assessment & Plan Note (Addendum)
-   We reviewed options of urethral bulking vs sling. She prefers to proceed with midurethral sling, cystoscopy - We reviewed the patient's specific anatomic and functional findings, with the assistance of diagrams, and together finalized the above procedure. The planned surgical procedures were discussed along with the surgical risks outlined below, which were also provided on a detailed handout. Additional treatment options including expectant management, conservative management, medical management were discussed where appropriate.  We reviewed the benefits and risks of each treatment option.   General Surgical Risks: For all procedures, there are risks of bleeding, infection, damage to surrounding organs including but not limited to bowel, bladder, blood vessels, ureters and nerves, and need for further surgery if an injury were to occur. These risks are all low with minimally invasive surgery.   There are risks of numbness and weakness at any body site or buttock/rectal pain.  It is possible that baseline pain can be worsened by surgery, either with or without mesh. If surgery is vaginal, there is also a low risk of possible conversion to laparoscopy or open abdominal incision where indicated. Very rare risks include blood transfusion, blood clot, heart attack, pneumonia, or death.   There is also a risk of short-term postoperative urinary retention with need to use a catheter. About half of patients need to go home from surgery with a catheter, which is then later removed in the office. The risk of long-term need for a catheter is very low. There is also a risk of worsening of overactive bladder.   Sling: The effectiveness of a midurethral vaginal mesh sling is approximately 85%, and thus, there will be times when you may leak urine after surgery, especially if your bladder is full or if you have a strong cough. There is a balance between making the sling tight enough to treat your leakage but not too  tight so that you have long-term difficulty emptying your bladder. A mesh sling will not directly treat overactive bladder/urge incontinence and may worsen it.  There is an FDA safety notification on vaginal mesh procedures for prolapse but NOT mesh slings. We have extensive experience and training with mesh placement and we have close postoperative follow up to identify any potential complications from mesh. It is important to realize that this mesh is a permanent implant that cannot be easily removed. There are rare risks of mesh exposure (2-4%), pain with intercourse (0-7%), and infection (<1%). The risk of mesh exposure if more likely in a woman with risks for poor healing (prior radiation, poorly controlled diabetes, or immunocompromised). The risk of new or worsened chronic pain after mesh implant is more common in women with baseline chronic pain and/or poorly controlled anxiety or depression. Approximately 2-4% of patients will experience longer-term post-operative voiding dysfunction that may require surgical revision of the sling. We also reviewed that postoperatively, her stream may not be as strong as before surgery.    - For preop Visit:  She is required to have a visit within 30 days of her surgery.   - Medical clearance: not required  - Anticoagulant use: No - Medicaid Hysterectomy form: No - Accepts blood transfusion: Yes - Expected length of stay: outpatient  Request sent for surgery scheduling.

## 2024-05-16 NOTE — Assessment & Plan Note (Signed)
-   Continue with myrbetriq  25

## 2024-05-16 NOTE — Assessment & Plan Note (Signed)
-   did not see prolapse to -1 today and patient is asymptomatic so will defer any prolapse repair

## 2024-05-16 NOTE — Progress Notes (Signed)
 [facility] Return Visit  SUBJECTIVE  History of Present Illness: Rebecca Alvarado is a 58 y.o. female seen in follow-up for mixed incontinence, stress predominant. She is interested in a procedure for SUI. She is on myrbetriq  25mg  for OAB symptoms. Was noted to have mild anterior prolapse but not symptomatic.   Also underwent CT for hematuria workup and was noted to have tiny non-obstructive renal stones. Currently has no symptoms. Has history of renal stones in the past.   Past Medical History: Patient  has a past medical history of Migraine, Migraine without aura, without mention of intractable migraine without mention of status migrainosus (12/30/2013), and Renal calculi.   Past Surgical History: She  has a past surgical history that includes Partial hysterectomy; scar tissue removal (07/03/1993); Elbow surgery (Right, 08/03/2014); ORIF humerus fracture (Left, 04/29/2021); and Shoulder surgery (Left, 04/2022).   Medications: She has a current medication list which includes the following prescription(s): amitriptyline , bismuth subsalicylate, estradiol , losartan, minivelle , mirabegron  er, OVER THE COUNTER MEDICATION, pantoprazole, propranolol , relpax , and ubrelvy .   Allergies: Patient is allergic to hydrocodone-acetaminophen , other, and topamax [topiramate].   Social History: Patient  reports that she has never smoked. She has never used smokeless tobacco. She reports that she does not currently use alcohol. She reports that she does not use drugs.     OBJECTIVE     Physical Exam: Vitals:   05/16/24 0928  BP: 117/89  Pulse: 98   Gen: No apparent distress, A&O x 3.  Detailed Urogynecologic Evaluation:  + CST  On speculum exam, minimal anterior prolapse present with cough/ valsalva  Prior exam showed:  POP-Q   -1                                            Aa   -1                                           Ba   -7.5                                               C    3.5                                            Gh   7                                            Pb   10                                            tvl    -2  Ap   -2                                            Bp                                                  D        ASSESSMENT AND PLAN    Rebecca Alvarado is a 58 y.o. with:  1. SUI (stress urinary incontinence, female)   2. Overactive bladder   3. Prolapse of anterior vaginal wall     SUI (stress urinary incontinence, female) Assessment & Plan: - We reviewed options of urethral bulking vs sling. She prefers to proceed with midurethral sling, cystoscopy - We reviewed the patient's specific anatomic and functional findings, with the assistance of diagrams, and together finalized the above procedure. The planned surgical procedures were discussed along with the surgical risks outlined below, which were also provided on a detailed handout. Additional treatment options including expectant management, conservative management, medical management were discussed where appropriate.  We reviewed the benefits and risks of each treatment option.   General Surgical Risks: For all procedures, there are risks of bleeding, infection, damage to surrounding organs including but not limited to bowel, bladder, blood vessels, ureters and nerves, and need for further surgery if an injury were to occur. These risks are all low with minimally invasive surgery.   There are risks of numbness and weakness at any body site or buttock/rectal pain.  It is possible that baseline pain can be worsened by surgery, either with or without mesh. If surgery is vaginal, there is also a low risk of possible conversion to laparoscopy or open abdominal incision where indicated. Very rare risks include blood transfusion, blood clot, heart attack, pneumonia, or death.   There is also a risk of short-term postoperative urinary  retention with need to use a catheter. About half of patients need to go home from surgery with a catheter, which is then later removed in the office. The risk of long-term need for a catheter is very low. There is also a risk of worsening of overactive bladder.   Sling: The effectiveness of a midurethral vaginal mesh sling is approximately 85%, and thus, there will be times when you may leak urine after surgery, especially if your bladder is full or if you have a strong cough. There is a balance between making the sling tight enough to treat your leakage but not too tight so that you have long-term difficulty emptying your bladder. A mesh sling will not directly treat overactive bladder/urge incontinence and may worsen it.  There is an FDA safety notification on vaginal mesh procedures for prolapse but NOT mesh slings. We have extensive experience and training with mesh placement and we have close postoperative follow up to identify any potential complications from mesh. It is important to realize that this mesh is a permanent implant that cannot be easily removed. There are rare risks of mesh exposure (2-4%), pain with intercourse (0-7%), and infection (<1%). The risk of mesh exposure if more likely in a woman with risks for poor healing (prior radiation, poorly controlled diabetes, or immunocompromised). The risk of new or worsened chronic pain after mesh implant is  more common in women with baseline chronic pain and/or poorly controlled anxiety or depression. Approximately 2-4% of patients will experience longer-term post-operative voiding dysfunction that may require surgical revision of the sling. We also reviewed that postoperatively, her stream may not be as strong as before surgery.    - For preop Visit:  She is required to have a visit within 30 days of her surgery.   - Medical clearance: not required  - Anticoagulant use: No - Medicaid Hysterectomy form: No - Accepts blood transfusion: Yes -  Expected length of stay: outpatient  Request sent for surgery scheduling.    Overactive bladder Assessment & Plan: - Continue with myrbetriq  25   Prolapse of anterior vaginal wall Assessment & Plan: - did not see prolapse to -1 today and patient is asymptomatic so will defer any prolapse repair      [name], MD

## 2024-05-28 ENCOUNTER — Encounter: Payer: Self-pay | Admitting: Obstetrics and Gynecology

## 2024-06-04 ENCOUNTER — Encounter: Payer: Self-pay | Admitting: Obstetrics and Gynecology

## 2024-07-09 ENCOUNTER — Other Ambulatory Visit: Payer: Self-pay | Admitting: Obstetrics and Gynecology

## 2024-07-09 DIAGNOSIS — N393 Stress incontinence (female) (male): Secondary | ICD-10-CM

## 2024-07-09 NOTE — Progress Notes (Signed)
  Urogynecology Pre-Operative Exam  Subjective Chief Complaint: Rebecca Alvarado presents for a preoperative encounter.   History of Present Illness: Rebecca Alvarado is a 59 y.o. female who presents for preoperative visit.  She is scheduled to undergo Exam under anesthesia, midurethral sling, cystoscopy  on 07/23/24.  Her symptoms include Stress urinary incontinence, and she was was found to have Stage I anterior, Stage I posterior, Stage I apical prolapse.   Urodynamics showed: Deferred, + CST on exam with Dr. Marilynne  Past Medical History:  Diagnosis Date   Migraine    Migraine without aura, without mention of intractable migraine without mention of status migrainosus 12/30/2013   Renal calculi      Past Surgical History:  Procedure Laterality Date   ELBOW SURGERY Right 08/03/2014   ORIF HUMERUS FRACTURE Left 04/29/2021   Procedure: LEFT PROXIMAL HUMERUS FRACTURE FIXATION, BONEGRAFTING FROM PELVIS;  Surgeon: Addie Cordella Hamilton, MD;  Location: [facility] OR;  Service: Orthopedics;  Laterality: Left;   PARTIAL HYSTERECTOMY     scar tissue removal  07/03/1993   leg   SHOULDER SURGERY Left 04/2022    is allergic to hydrocodone-acetaminophen , other, and topamax [topiramate].   Family History  Problem Relation Age of Onset   Migraines Mother    Neuropathy Father    Multiple myeloma Father    Migraines Daughter     Social History[1]   Review of Systems was negative for a full 10 system review except as noted in the History of Present Illness.  Current Medications[2]   Objective There were no vitals filed for this visit.  Gen: NAD CV: S1 S2 RRR Lungs: Clear to auscultation bilaterally Abd: soft, nontender   Previous Pelvic Exam showed: POP-Q   -1                                            Aa   -1                                           Ba   -7.5                                              C    3.5                                            Gh   7                                             Pb   10                                            tvl    -2  Ap   -2                                            Bp                                                  D      Assessment/ Plan  Assessment: The patient is a 59 y.o. year old scheduled to undergo  Exam under anesthesia, midurethral sling, cystoscopy. Verbal consent was obtained for these procedures.  Plan: General Surgical Consent: The patient has previously been counseled on alternative treatments, and the decision by the patient and provider was to proceed with the procedure listed above.  For all procedures, there are risks of bleeding, infection, damage to surrounding organs including but not limited to bowel, bladder, blood vessels, ureters and nerves, and need for further surgery if an injury were to occur. These risks are all low with minimally invasive surgery.   There are risks of numbness and weakness at any body site or buttock/rectal pain.  It is possible that baseline pain can be worsened by surgery, either with or without mesh. If surgery is vaginal, there is also a low risk of possible conversion to laparoscopy or open abdominal incision where indicated. Very rare risks include blood transfusion, blood clot, heart attack, pneumonia, or death.   There is also a risk of short-term postoperative urinary retention with need to use a catheter. About half of patients need to go home from surgery with a catheter, which is then later removed in the office. The risk of long-term need for a catheter is very low. There is also a risk of worsening of overactive bladder.   Sling: The effectiveness of a midurethral vaginal mesh sling is approximately 85%, and thus, there will be times when you may leak urine after surgery, especially if your bladder is full or if you have a strong cough. There is a balance between making the sling tight enough to  treat your leakage but not too tight so that you have long-term difficulty emptying your bladder. A mesh sling will not directly treat overactive bladder/urge incontinence and may worsen it.  There is an FDA safety notification on vaginal mesh procedures for prolapse but NOT mesh slings. We have extensive experience and training with mesh placement and we have close postoperative follow up to identify any potential complications from mesh. It is important to realize that this mesh is a permanent implant that cannot be easily removed. There are rare risks of mesh exposure (2-4%), pain with intercourse (0-7%), and infection (<1%). The risk of mesh exposure if more likely in a woman with risks for poor healing (prior radiation, poorly controlled diabetes, or immunocompromised). The risk of new or worsened chronic pain after mesh implant is more common in women with baseline chronic pain and/or poorly controlled anxiety or depression. Approximately 2-4% of patients will experience longer-term post-operative voiding dysfunction that may require surgical revision of the sling. We also reviewed that postoperatively, her stream may not be as strong as before surgery.    We discussed consent for blood products. Risks for blood transfusion include allergic reactions, other reactions that can affect different body organs and managed accordingly, transmission of  infectious diseases such as HIV or Hepatitis. However, the blood is screened. Patient consents for blood products.***  Pre-operative instructions:  She was instructed to not take Aspirin/NSAIDs x 7days prior to surgery.  Antibiotic prophylaxis was ordered as indicated.  Catheter use: Patient will go home with foley if needed after post-operative voiding trial.  Post-operative instructions:  She was provided with specific post-operative instructions, including precautions and signs/symptoms for which we would recommend contacting us , in addition to daytime and  after-hours contact phone numbers. This was provided on a handout.   Post-operative medications: Prescriptions for motrin, tylenol , miralax, and oxycodone  were sent to her pharmacy. Discussed using ibuprofen and tylenol  on a schedule to limit use of narcotics.   Laboratory testing:  We will check labs: As requested by anesthesia.   Preoperative clearance:  She does not require surgical clearance.    Post-operative follow-up:  A post-operative appointment will be made for 6 weeks from the date of surgery. If she needs a post-operative nurse visit for a voiding trial, that will be set up after she leaves the hospital.    Patient will call the clinic or use MyChart should anything change or any new issues arise.   Davan Hark G Kyren Knick, NP       [1]  Social History Tobacco Use   Smoking status: Never   Smokeless tobacco: Never  Vaping Use   Vaping status: Never Used  Substance Use Topics   Alcohol use: Not Currently    Comment: occasional   Drug use: No  [2]  Current Outpatient Medications:    amitriptyline  (ELAVIL ) 10 MG tablet, Take 2 tablets (20 mg total) by mouth at bedtime., Disp: 180 tablet, Rfl: 3   bismuth subsalicylate (PEPTO BISMOL) 262 MG chewable tablet, Chew 524 mg by mouth as needed for diarrhea or loose stools or indigestion., Disp: , Rfl:    estradiol  (ESTRACE ) 0.1 MG/GM vaginal cream, Place 0.5 g vaginally 2 (two) times a week. Place 0.5g nightly for two weeks then twice a week after, Disp: 42.5 g, Rfl: 11   losartan (COZAAR) 50 MG tablet, Take 1 tablet by mouth daily., Disp: , Rfl:    MINIVELLE  0.1 MG/24HR patch, Place 1 patch onto the skin 2 (two) times a week. , Disp: , Rfl:    mirabegron  ER (MYRBETRIQ ) 25 MG TB24 tablet, Take 1 tablet (25 mg total) by mouth daily., Disp: 30 tablet, Rfl: 5   OVER THE COUNTER MEDICATION, Apply 1 application topically at bedtime. Vitamin C facial cream, Disp: , Rfl:    pantoprazole (PROTONIX) 40 MG tablet, Take 40 mg by mouth daily.,  Disp: , Rfl:    propranolol  (INDERAL ) 20 MG tablet, Take 1 tablet (20 mg total) by mouth 2 (two) times daily., Disp: 180 tablet, Rfl: 1   RELPAX  40 MG tablet, TAKE 1 TABLET BY MOUTH AT ONSET OF MIGRAINE. CAN REPEAT IN 2 HOURS IFNEEDED. NOT TO EXCEED 2 TABS IN 24 HOURS, Disp: 10 tablet, Rfl: 7   Ubrogepant  (UBRELVY ) 100 MG TABS, Take 1 tablet (100 mg total) by mouth daily as needed. Take one tablet at onset of headache, may repeat 1 tablet in 2 hours, no more than 2 tablets in 24 hours, Disp: 8 tablet, Rfl: 11

## 2024-07-09 NOTE — H&P (Signed)
 [facility] H&P  Subjective Chief Complaint: Rebecca Alvarado presents for a preoperative encounter.   History of Present Illness: Rebecca Alvarado is a 59 y.o. female who presents for preoperative visit.  She is scheduled to undergo Exam under anesthesia, midurethral sling, cystoscopy  on 07/23/24.  Her symptoms include Stress urinary incontinence, and she was was found to have Stage I anterior, Stage I posterior, Stage I apical prolapse.   Urodynamics showed: Deferred, + CST on exam with Dr. Marilynne  Past Medical History:  Diagnosis Date   Migraine    Migraine without aura, without mention of intractable migraine without mention of status migrainosus 12/30/2013   Renal calculi      Past Surgical History:  Procedure Laterality Date   ELBOW SURGERY Right 08/03/2014   ORIF HUMERUS FRACTURE Left 04/29/2021   Procedure: LEFT PROXIMAL HUMERUS FRACTURE FIXATION, BONEGRAFTING FROM PELVIS;  Surgeon: Addie Cordella Hamilton, MD;  Location: [facility] OR;  Service: Orthopedics;  Laterality: Left;   PARTIAL HYSTERECTOMY     scar tissue removal  07/03/1993   leg   SHOULDER SURGERY Left 04/2022    is allergic to hydrocodone-acetaminophen , other, and topamax [topiramate].   Family History  Problem Relation Age of Onset   Migraines Mother    Neuropathy Father    Multiple myeloma Father    Migraines Daughter     [Social History]  [Social History] Tobacco Use   Smoking status: Never   Smokeless tobacco: Never  Vaping Use   Vaping status: Never Used  Substance Use Topics   Alcohol use: Not Currently    Comment: occasional   Drug use: No     Review of Systems was negative for a full 10 system review except as noted in the History of Present Illness.  [Current Medications]    [Current Medications]  Current Outpatient Medications:    amitriptyline  (ELAVIL ) 10 MG tablet, Take 2 tablets (20 mg total) by mouth at bedtime., Disp: 180 tablet, Rfl: 3   bismuth subsalicylate  (PEPTO BISMOL) 262 MG chewable tablet, Chew 524 mg by mouth as needed for diarrhea or loose stools or indigestion., Disp: , Rfl:    estradiol  (ESTRACE ) 0.1 MG/GM vaginal cream, Place 0.5 g vaginally 2 (two) times a week. Place 0.5g nightly for two weeks then twice a week after, Disp: 42.5 g, Rfl: 11   losartan (COZAAR) 50 MG tablet, Take 1 tablet by mouth daily., Disp: , Rfl:    MINIVELLE  0.1 MG/24HR patch, Place 1 patch onto the skin 2 (two) times a week. , Disp: , Rfl:    mirabegron  ER (MYRBETRIQ ) 25 MG TB24 tablet, Take 1 tablet (25 mg total) by mouth daily., Disp: 30 tablet, Rfl: 5   OVER THE COUNTER MEDICATION, Apply 1 application topically at bedtime. Vitamin C facial cream, Disp: , Rfl:    pantoprazole (PROTONIX) 40 MG tablet, Take 40 mg by mouth daily., Disp: , Rfl:    propranolol  (INDERAL ) 20 MG tablet, Take 1 tablet (20 mg total) by mouth 2 (two) times daily., Disp: 180 tablet, Rfl: 1   RELPAX  40 MG tablet, TAKE 1 TABLET BY MOUTH AT ONSET OF MIGRAINE. CAN REPEAT IN 2 HOURS IFNEEDED. NOT TO EXCEED 2 TABS IN 24 HOURS, Disp: 10 tablet, Rfl: 7   Ubrogepant  (UBRELVY ) 100 MG TABS, Take 1 tablet (100 mg total) by mouth daily as needed. Take one tablet at onset of headache, may repeat 1 tablet in 2 hours, no more than 2 tablets in 24 hours,  Disp: 8 tablet, Rfl: 11  Objective There were no vitals filed for this visit.  Gen: NAD CV: S1 S2 RRR Lungs: Clear to auscultation bilaterally Abd: soft, nontender   Previous Pelvic Exam showed: POP-Q   -1                                            Aa   -1                                           Ba   -7.5                                              C    3.5                                            Gh   7                                            Pb   10                                            tvl    -2                                            Ap   -2                                            Bp                                                   D      Assessment/ Plan  Assessment: The patient is a 58 y.o. year old scheduled to undergo  Exam under anesthesia, midurethral sling, cystoscopy. Verbal consent was obtained for these procedures.

## 2024-07-10 ENCOUNTER — Ambulatory Visit (INDEPENDENT_AMBULATORY_CARE_PROVIDER_SITE_OTHER): Admitting: Obstetrics and Gynecology

## 2024-07-10 ENCOUNTER — Ambulatory Visit: Admitting: Obstetrics and Gynecology

## 2024-07-10 ENCOUNTER — Encounter: Payer: Self-pay | Admitting: Obstetrics and Gynecology

## 2024-07-10 VITALS — BP 107/70 | HR 86 | Ht 68.0 in | Wt 233.0 lb

## 2024-07-10 DIAGNOSIS — Z01818 Encounter for other preprocedural examination: Secondary | ICD-10-CM

## 2024-07-10 MED ORDER — ACETAMINOPHEN 500 MG PO TABS: 500.0000 mg | ORAL_TABLET | Freq: Four times a day (QID) | ORAL | 0 refills | Status: AC | PRN

## 2024-07-10 MED ORDER — OXYCODONE HCL 5 MG PO TABS: 5.0000 mg | ORAL_TABLET | ORAL | 0 refills | Status: AC | PRN

## 2024-07-10 MED ORDER — POLYETHYLENE GLYCOL 3350 17 GM/SCOOP PO POWD: 17.0000 g | Freq: Every day | ORAL | 0 refills | Status: AC

## 2024-07-10 MED ORDER — IBUPROFEN 600 MG PO TABS: 600.0000 mg | ORAL_TABLET | Freq: Four times a day (QID) | ORAL | 0 refills | Status: AC | PRN

## 2024-07-14 ENCOUNTER — Encounter: Payer: Self-pay | Admitting: *Deleted

## 2024-07-15 ENCOUNTER — Ambulatory Visit: Admitting: Podiatry

## 2024-07-21 ENCOUNTER — Encounter (HOSPITAL_COMMUNITY): Payer: Self-pay | Admitting: Obstetrics and Gynecology

## 2024-07-22 NOTE — Progress Notes (Signed)
 Still unable to reach patient instructions given. Arrival time of 1400. Clear liquids until 1300. May take Propranolo and Pantoprazole in AM. Nothing to eat after midnight and she would have to have a driver and someone to stay with her overnight after the surgery.

## 2024-07-22 NOTE — Progress Notes (Signed)
 Spoke w/ via phone for pre-op interview---Caela Lab needs dos----  none       Lab results------ COVID test -----patient states asymptomatic no test needed Arrive at -------1400 NPO after MN NO Solid Food.  Clear liquids from MN until---1300 Pre-Surgery Ensure or G2:  Med rec completed Medications to take morning of surgery -----beta blocker and acid reflux meds Diabetic medication -----  GLP1 agonist last dose:2 weeks ago GLP1 instructions:  Patient instructed no nail polish to be worn day of surgery Patient instructed to bring photo id and insurance card day of surgery Patient aware to have Driver (ride ) / caregiver    for 24 hours after surgery - spouse Patient Special Instructions ----- Pre-Op special Instructions -----  Patient verbalized understanding of instructions that were given at this phone interview. Patient denies chest pain, sob, fever, cough at the interview.

## 2024-07-23 ENCOUNTER — Encounter (HOSPITAL_COMMUNITY)
Admission: RE | Disposition: A | Discharge status: Home / Self Care | Payer: Self-pay | Source: Home / Self Care | Attending: Obstetrics and Gynecology

## 2024-07-23 ENCOUNTER — Encounter (HOSPITAL_COMMUNITY): Payer: Self-pay | Admitting: Obstetrics and Gynecology

## 2024-07-23 ENCOUNTER — Ambulatory Visit (HOSPITAL_COMMUNITY)
Admission: RE | Admit: 2024-07-23 | Discharge: 2024-07-23 | Disposition: A | Discharge status: Home / Self Care | Attending: Obstetrics and Gynecology | Admitting: Obstetrics and Gynecology

## 2024-07-23 ENCOUNTER — Ambulatory Visit (HOSPITAL_COMMUNITY): Admitting: Anesthesiology

## 2024-07-23 DIAGNOSIS — I10 Essential (primary) hypertension: Secondary | ICD-10-CM | POA: Diagnosis not present

## 2024-07-23 DIAGNOSIS — K219 Gastro-esophageal reflux disease without esophagitis: Secondary | ICD-10-CM | POA: Diagnosis not present

## 2024-07-23 DIAGNOSIS — N393 Stress incontinence (female) (male): Secondary | ICD-10-CM | POA: Diagnosis present

## 2024-07-23 HISTORY — PX: CYSTOSCOPY: SHX5120

## 2024-07-23 HISTORY — DX: Essential (primary) hypertension: I10

## 2024-07-23 HISTORY — PX: BLADDER SUSPENSION: SHX72

## 2024-07-23 MED ORDER — SODIUM CHLORIDE 0.9 % IR SOLN
Status: DC | PRN
  Administered 2024-07-23: 1000 mL via INTRAVESICAL

## 2024-07-23 MED ORDER — OXYCODONE HCL 5 MG/5ML PO SOLN: 5.0000 mg | Freq: Once | ORAL | Status: DC | PRN

## 2024-07-23 MED ORDER — DEXAMETHASONE SOD PHOSPHATE PF 10 MG/ML IJ SOLN
INTRAMUSCULAR | Status: AC
  Filled 2024-07-23: qty 1

## 2024-07-23 MED ORDER — ONDANSETRON HCL 4 MG/2ML IJ SOLN
INTRAMUSCULAR | Status: DC | PRN
  Administered 2024-07-23: 4 mg via INTRAVENOUS

## 2024-07-23 MED ORDER — DEXAMETHASONE SOD PHOSPHATE PF 10 MG/ML IJ SOLN
INTRAMUSCULAR | Status: DC | PRN
  Administered 2024-07-23: 8 mg via INTRAVENOUS

## 2024-07-23 MED ORDER — ONDANSETRON HCL 4 MG/2ML IJ SOLN
INTRAMUSCULAR | Status: AC
  Filled 2024-07-23: qty 2

## 2024-07-23 MED ORDER — CHLORHEXIDINE GLUCONATE 0.12 % MT SOLN
OROMUCOSAL | Status: AC
  Filled 2024-07-23: qty 15

## 2024-07-23 MED ORDER — LACTATED RINGERS IV SOLN: INTRAVENOUS | Status: DC

## 2024-07-23 MED ORDER — LIDOCAINE 2% (20 MG/ML) 5 ML SYRINGE
INTRAMUSCULAR | Status: DC | PRN
  Administered 2024-07-23: 100 mg via INTRAVENOUS

## 2024-07-23 MED ORDER — EPHEDRINE SULFATE-NACL 50-0.9 MG/10ML-% IV SOSY
PREFILLED_SYRINGE | INTRAVENOUS | Status: DC | PRN
  Administered 2024-07-23: 5 mg via INTRAVENOUS

## 2024-07-23 MED ORDER — PHENYLEPHRINE 80 MCG/ML (10ML) SYRINGE FOR IV PUSH (FOR BLOOD PRESSURE SUPPORT)
PREFILLED_SYRINGE | INTRAVENOUS | Status: AC
  Filled 2024-07-23: qty 10

## 2024-07-23 MED ORDER — CHLORHEXIDINE GLUCONATE 0.12 % MT SOLN
15.0000 mL | Freq: Once | OROMUCOSAL | Status: AC
  Administered 2024-07-23: 15 mL via OROMUCOSAL

## 2024-07-23 MED ORDER — PHENYLEPHRINE HCL-NACL 20-0.9 MG/250ML-% IV SOLN
INTRAVENOUS | Status: DC | PRN
  Administered 2024-07-23: 20 ug/min via INTRAVENOUS

## 2024-07-23 MED ORDER — CEFAZOLIN SODIUM-DEXTROSE 2-4 GM/100ML-% IV SOLN
INTRAVENOUS | Status: AC
  Filled 2024-07-23: qty 100

## 2024-07-23 MED ORDER — CEFAZOLIN SODIUM-DEXTROSE 2-4 GM/100ML-% IV SOLN
2.0000 g | INTRAVENOUS | Status: AC
  Administered 2024-07-23: 2 g via INTRAVENOUS

## 2024-07-23 MED ORDER — GABAPENTIN 300 MG PO CAPS
300.0000 mg | ORAL_CAPSULE | ORAL | Status: AC
  Administered 2024-07-23: 300 mg via ORAL

## 2024-07-23 MED ORDER — AMISULPRIDE (ANTIEMETIC) 5 MG/2ML IV SOLN: 10.0000 mg | Freq: Once | INTRAVENOUS | Status: DC | PRN

## 2024-07-23 MED ORDER — HYDROMORPHONE HCL 1 MG/ML IJ SOLN
INTRAMUSCULAR | Status: DC | PRN
  Administered 2024-07-23: .5 mg via INTRAVENOUS

## 2024-07-23 MED ORDER — ACETAMINOPHEN 500 MG PO TABS
1000.0000 mg | ORAL_TABLET | ORAL | Status: AC
  Administered 2024-07-23: 1000 mg via ORAL

## 2024-07-23 MED ORDER — LIDOCAINE 2% (20 MG/ML) 5 ML SYRINGE
INTRAMUSCULAR | Status: AC
  Filled 2024-07-23: qty 5

## 2024-07-23 MED ORDER — PHENYLEPHRINE 80 MCG/ML (10ML) SYRINGE FOR IV PUSH (FOR BLOOD PRESSURE SUPPORT)
PREFILLED_SYRINGE | INTRAVENOUS | Status: DC | PRN
  Administered 2024-07-23: 80 ug via INTRAVENOUS
  Administered 2024-07-23: 160 ug via INTRAVENOUS

## 2024-07-23 MED ORDER — MIDAZOLAM HCL 5 MG/5ML IJ SOLN
INTRAMUSCULAR | Status: DC | PRN
  Administered 2024-07-23: 2 mg via INTRAVENOUS

## 2024-07-23 MED ORDER — GLYCOPYRROLATE PF 0.2 MG/ML IJ SOSY
PREFILLED_SYRINGE | INTRAMUSCULAR | Status: AC
  Filled 2024-07-23: qty 1

## 2024-07-23 MED ORDER — PROPOFOL 10 MG/ML IV BOLUS
INTRAVENOUS | Status: DC | PRN
  Administered 2024-07-23: 100 mg via INTRAVENOUS
  Administered 2024-07-23: 50 mg via INTRAVENOUS
  Administered 2024-07-23: 200 mg via INTRAVENOUS

## 2024-07-23 MED ORDER — GABAPENTIN 300 MG PO CAPS
ORAL_CAPSULE | ORAL | Status: AC
  Filled 2024-07-23: qty 1

## 2024-07-23 MED ORDER — ORAL CARE MOUTH RINSE: 15.0000 mL | Freq: Once | OROMUCOSAL | Status: AC

## 2024-07-23 MED ORDER — KETOROLAC TROMETHAMINE 30 MG/ML IJ SOLN
INTRAMUSCULAR | Status: AC
  Filled 2024-07-23: qty 1

## 2024-07-23 MED ORDER — FENTANYL CITRATE (PF) 100 MCG/2ML IJ SOLN: 25.0000 ug | INTRAMUSCULAR | Status: DC | PRN

## 2024-07-23 MED ORDER — ACETAMINOPHEN 500 MG PO TABS
ORAL_TABLET | ORAL | Status: AC
  Filled 2024-07-23: qty 2

## 2024-07-23 MED ORDER — MIDAZOLAM HCL 2 MG/2ML IJ SOLN
INTRAMUSCULAR | Status: AC
  Filled 2024-07-23: qty 2

## 2024-07-23 MED ORDER — HYDROMORPHONE HCL 1 MG/ML IJ SOLN
INTRAMUSCULAR | Status: AC
  Filled 2024-07-23: qty 0.5

## 2024-07-23 MED ORDER — KETOROLAC TROMETHAMINE 30 MG/ML IJ SOLN
INTRAMUSCULAR | Status: DC | PRN
  Administered 2024-07-23: 30 mg via INTRAVENOUS

## 2024-07-23 MED ORDER — EPHEDRINE 5 MG/ML INJ
INTRAVENOUS | Status: AC
  Filled 2024-07-23: qty 5

## 2024-07-23 MED ORDER — PHENAZOPYRIDINE HCL 100 MG PO TABS
ORAL_TABLET | ORAL | Status: AC
  Filled 2024-07-23: qty 2

## 2024-07-23 MED ORDER — OXYCODONE HCL 5 MG PO TABS: 5.0000 mg | ORAL_TABLET | Freq: Once | ORAL | Status: DC | PRN

## 2024-07-23 MED ORDER — FENTANYL CITRATE (PF) 100 MCG/2ML IJ SOLN
INTRAMUSCULAR | Status: AC
  Filled 2024-07-23: qty 2

## 2024-07-23 MED ORDER — FENTANYL CITRATE (PF) 100 MCG/2ML IJ SOLN
INTRAMUSCULAR | Status: DC | PRN
  Administered 2024-07-23 (×2): 50 ug via INTRAVENOUS

## 2024-07-23 MED ORDER — LIDOCAINE-EPINEPHRINE 1 %-1:100000 IJ SOLN
INTRAMUSCULAR | Status: DC | PRN
  Administered 2024-07-23: 10 mL

## 2024-07-23 MED ORDER — 0.9 % SODIUM CHLORIDE (POUR BTL) OPTIME
TOPICAL | Status: DC | PRN
  Administered 2024-07-23: 1000 mL

## 2024-07-23 MED ORDER — GLYCOPYRROLATE PF 0.2 MG/ML IJ SOSY
PREFILLED_SYRINGE | INTRAMUSCULAR | Status: DC | PRN
  Administered 2024-07-23: .2 mg via INTRAVENOUS

## 2024-07-23 MED ORDER — PHENAZOPYRIDINE HCL 100 MG PO TABS
200.0000 mg | ORAL_TABLET | ORAL | Status: AC
  Administered 2024-07-23: 200 mg via ORAL

## 2024-07-23 NOTE — Interval H&P Note (Signed)
 History and Physical Interval Note:  07/23/2024 2:17 PM  Rebecca Alvarado  has presented today for surgery, with the diagnosis of stress urinary incontience.  The various methods of treatment have been discussed with the patient and family. After consideration of risks, benefits and other options for treatment, the patient has consented to  Procedures with comments: CREATION, URETHRAL SLING, RETROPUBIC APPROACH, USING POLYPROPYLENE TAPE (N/A) - midurethral sling CYSTOSCOPY (N/A) as a surgical intervention.  The patient's history has been reviewed, patient examined, no change in status, stable for surgery.  I have reviewed the patient's chart and labs.  Questions were answered to the patient's satisfaction.     [name]

## 2024-07-23 NOTE — Anesthesia Procedure Notes (Signed)
 Procedure Name: LMA Insertion Date/Time: 07/23/2024 2:52 PM  Performed by: Denton Niels CROME, CRNAPre-anesthesia Checklist: Patient identified, Emergency Drugs available, Suction available, Patient being monitored and Timeout performed Patient Re-evaluated:Patient Re-evaluated prior to induction Oxygen Delivery Method: Circle system utilized Preoxygenation: Pre-oxygenation with 100% oxygen Induction Type: IV induction Ventilation: Mask ventilation without difficulty LMA: LMA with gastric port inserted LMA Size: 4.0 Number of attempts: 2 Placement Confirmation: positive ETCO2 Dental Injury: Teeth and Oropharynx as per pre-operative assessment

## 2024-07-23 NOTE — Anesthesia Postprocedure Evaluation (Signed)
"   Anesthesia Post Note  Patient: Rebecca Alvarado  Procedure(s) Performed: CREATION, URETHRAL SLING, RETROPUBIC APPROACH, USING POLYPROPYLENE TAPE (Vagina ) CYSTOSCOPY (Bladder)     Patient location during evaluation: PACU Anesthesia Type: General Level of consciousness: awake Pain management: pain level controlled Vital Signs Assessment: post-procedure vital signs reviewed and stable Respiratory status: spontaneous breathing, nonlabored ventilation and respiratory function stable Cardiovascular status: blood pressure returned to baseline and stable Postop Assessment: no apparent nausea or vomiting Anesthetic complications: no   No notable events documented.  Last Vitals:  Vitals:   07/23/24 1629 07/23/24 1640  BP:    Pulse: 84 86  Resp: 15 16  Temp: 36.5 C   SpO2: 91% 94%    Last Pain:  Vitals:   07/23/24 1615  TempSrc:   PainSc: 0-No pain                 [name]      "

## 2024-07-23 NOTE — OR Nursing (Signed)
 Voiding trial begins.  300cc sterile water  instilled into bladder via cathetor. Cathetor balloon deflated and removed.  Patient assisted to restroom voided 275cc clear yellow/orange urine. Patient bladder scanned for 0cc x 2. Dr. Marilynne notified and orders received to disharge to home without cathetor. Andree Kerns rn

## 2024-07-23 NOTE — Transfer of Care (Signed)
 Immediate Anesthesia Transfer of Care Note  Patient: Rebecca Alvarado  Procedure(s) Performed: CREATION, URETHRAL SLING, RETROPUBIC APPROACH, USING POLYPROPYLENE TAPE (Vagina ) CYSTOSCOPY (Bladder)  Patient Location: PACU  Anesthesia Type:General  Level of Consciousness: awake, alert , oriented, and patient cooperative  Airway & Oxygen Therapy: Patient Spontanous Breathing  Post-op Assessment: Report given to RN and Post -op Vital signs reviewed and stable  Post vital signs: Reviewed and stable  Last Vitals:  Vitals Value Taken Time  BP 115/62 07/23/24 15:45  Temp 36.4 C 07/23/24 15:45  Pulse 95 07/23/24 15:47  Resp 19 07/23/24 15:47  SpO2 91 % 07/23/24 15:47  Vitals shown include unfiled device data.  Last Pain:  Vitals:   07/23/24 1332  TempSrc: Oral  PainSc: 0-No pain      Patients Stated Pain Goal: 5 (07/23/24 1332)  Complications: No notable events documented.

## 2024-07-23 NOTE — Anesthesia Preprocedure Evaluation (Addendum)
"                                    Anesthesia Evaluation  Patient identified by MRN, date of birth, ID band Patient awake    Reviewed: Allergy & Precautions, NPO status , Patient's Chart, lab work & pertinent test results  History of Anesthesia Complications Negative for: history of anesthetic complications  Airway Mallampati: III  TM Distance: >3 FB Neck ROM: Full   Comment: Previous grade II view with Miller 2, easy mask Dental  (+) Dental Advisory Given, Implants Bridge:   Pulmonary neg pulmonary ROS   Pulmonary exam normal breath sounds clear to auscultation       Cardiovascular hypertension (losartan), Pt. on medications (-) angina (-) Past MI, (-) Cardiac Stents and (-) CABG (-) dysrhythmias  Rhythm:Regular Rate:Normal     Neuro/Psych  Headaches, neg Seizures    GI/Hepatic Neg liver ROS,GERD  Medicated,,  Endo/Other  negative endocrine ROS    Renal/GU negative Renal ROS  Female GU complaint     Musculoskeletal   Abdominal  (+) + obese  Peds  Hematology negative hematology ROS (+)   Anesthesia Other Findings Last Mounjaro: 07/06/2024  Reproductive/Obstetrics                              Anesthesia Physical Anesthesia Plan  ASA: 2  Anesthesia Plan: General   Post-op Pain Management: Tylenol  PO (pre-op)*   Induction: Intravenous  PONV Risk Score and Plan: 3 and Ondansetron , Dexamethasone , Midazolam  and Treatment may vary due to age or medical condition  Airway Management Planned: LMA  Additional Equipment:   Intra-op Plan:   Post-operative Plan: Extubation in OR  Informed Consent: I have reviewed the patients History and Physical, chart, labs and discussed the procedure including the risks, benefits and alternatives for the proposed anesthesia with the patient or authorized representative who has indicated his/her understanding and acceptance.     Dental advisory given  Plan Discussed with: CRNA and  Anesthesiologist  Anesthesia Plan Comments: (Risks of general anesthesia discussed including, but not limited to, sore throat, hoarse voice, chipped/damaged teeth, injury to vocal cords, nausea and vomiting, allergic reactions, lung infection, heart attack, stroke, and death. All questions answered. )         Anesthesia Quick Evaluation  "

## 2024-07-23 NOTE — Discharge Instructions (Addendum)
 POST OPERATIVE INSTRUCTIONS  General Instructions Recovery (not bed rest) will last approximately 2-6 weeks Walking is encouraged, but refrain from strenuous exercise/ housework/ heavy lifting for 2 weeks. No lifting >10lbs  Nothing in the vagina- NO intercourse, tampons or douching for 6 weeks Bathing:  Do not submerge in water  (NO swimming, bath, hot tub, etc) until after your postop visit. You can shower starting the day after surgery.  No driving until you are not taking narcotic pain medicine and until your pain is well enough controlled that you can slam on the breaks or make sudden movements if needed.   Taking your medications Please take your acetaminophen  and ibuprofen  on a schedule for the first 48 hours. Take 600mg  ibuprofen , then take 500mg  acetaminophen  3 hours later, then continue to alternate ibuprofen  and acetaminophen . That way you are taking each type of medication every 6 hours. Take the prescribed narcotic (oxycodone , tramadol, etc) as needed, with a maximum being every 4 hours.  Take a stool softener daily to keep your stools soft and preventing you from straining. If you have diarrhea, you decrease your stool softener. This is explained more below. We have prescribed you Miralax .  Reasons to Call the Nurse (see last page for phone numbers) Heavy Bleeding (changing your pad every 1-2 hours) Persistent nausea/vomiting Fever (100.4 degrees or more) Incision problems (pus or other fluid coming out, redness, warmth, increased pain)  Things to Expect After Surgery Mild to Moderate pain is normal during the first day or two after surgery. If prescribed, take Ibuprofen  or Tylenol  first and use the stronger medicine for break-through pain. You can overlap these medicines because they work differently.   Constipation   To Prevent Constipation:  Eat a well-balanced diet including protein, grains, fresh fruit and vegetables.  Drink plenty of fluids. Walk regularly.  Depending  on specific instructions from your physician: take Miralax  daily and additionally you can add a stool softener (colace/ docusate) and fiber supplement. Continue as long as you're on pain medications.   To Treat Constipation:  If you do not have a bowel movement in 2 days after surgery, you can take 2 Tbs of Milk of Magnesia 1-2 times a day until you have a bowel movement. If diarrhea occurs, decrease the amount or stop the laxative. If no results with Milk of Magnesia, you can drink a bottle of magnesium citrate which you can purchase over the counter.  Fatigue:  This is a normal response to surgery and will improve with time.  Plan frequent rest periods throughout the day.  Gas Pain:  This is very common but can also be very painful! Drink warm liquids such as herbal teas, bouillon or soup. Walking will help you pass more gas.  Mylicon or Gas-X can be taken over the counter.  Leaking Urine:  Varying amounts of leakage may occur after surgery.  This should improve with time. Your bladder needs at least 3 months to recover from surgery. If you leak after surgery, be sure to mention this to your doctor at your post-op visit. If you were taking medications for overactive bladder prior to surgery, be sure to restart the medications immediately after surgery.  Incisions: If you have incisions on your abdomen, the skin glue will dissolve on its own over time. It is ok to gently rinse with soap and water  over these incisions but do not scrub.  Catheter Approximately 50% of patients are unable to urinate after surgery and need to go home with a  catheter. This allows your bladder to rest so it can return to full function. If you go home with a catheter, the office will call to set up a voiding trial a few days after surgery. For most patients, by this visit, they are able to urinate on their own. Long term catheter use is rare.   Return to Work  As work demands and recovery times vary widely, it is hard to  predict when you will want to return to work. If you have a desk job with no strenuous physical activity, and if you would like to return sooner than generally recommended, discuss this with your provider or call our office.   Post op concerns  For non-emergent issues, please call the Urogynecology Nurse. Please leave a message and someone will contact you within one business day.  You can also send a message through MyChart.   AFTER HOURS (After 5:00 PM and on weekends):  For urgent matters that cannot wait until the next business day. Call our office [phone] and connect to the doctor on call.  Please reserve this for important issues.   **FOR ANY TRUE EMERGENCY ISSUES CALL 911 OR GO TO THE NEAREST EMERGENCY ROOM.** Please inform our office or the doctor on call of any emergency.     APPOINTMENTS: Call [phone]  Post Anesthesia Home Care Instructions  Activity: Get plenty of rest for the remainder of the day. A responsible adult should stay with you for 24 hours following the procedure.  For the next 24 hours, DO NOT: -Drive a car -Advertising copywriter -Drink alcoholic beverages -Take any medication unless instructed by your physician -Make any legal decisions or sign important papers.  Meals: Start with liquid foods such as gelatin or soup. Progress to regular foods as tolerated. Avoid greasy, spicy, heavy foods. If nausea and/or vomiting occur, drink only clear liquids until the nausea and/or vomiting subsides. Call your physician if vomiting continues.  Special Instructions/Symptoms: Your throat may feel dry or sore from the anesthesia or the breathing tube placed in your throat during surgery. If this causes discomfort, gargle with warm salt water . The discomfort should disappear within 24 hours.

## 2024-07-23 NOTE — Op Note (Signed)
 Operative Note  Preoperative Diagnosis: stress urinary incontinence  Postoperative Diagnosis: same  Procedures performed:  Midurethral sling (Advantage Fit), cystoscopy  Implants:  Implant Name Type Inv. Item Serial No. Manufacturer Lot No. LRB No. Used Action  Rebecca Alvarado [facility] - [id number] Sling SLING ADVANTAGE FIT Rebecca Alvarado [employer] [id number] N/A 1 Implanted    Attending Surgeon: Rosaline Caper, MD  Anesthesia: General LMA  Findings:  On cystoscopy, normal bladder and urethral mucosa without injury or lesion. Brisk bilateral ureteral efflux present.      Specimens: none  Estimated blood loss: 100 mL  IV fluids: 800 mL  Urine output: 100 mL  Complications: none  Procedure in Detail:  After informed consent was obtained, the patient was taken to the operating room where she was placed under anesthesia.  She was then placed in the dorsal lithotomy position with allen stirrups,  and prepped and draped in the usual sterile fashion.  A strap was placed across her upper abdomen on top of her gown so it was not in direct contact with her skin.  Care was taken to avoid hyperflexion or hyperextension of her upper and lower extremities. A foley catheter was placed.  A lonestar self-retraining retractor was placed with 4 stay hooks. The mid urethral area was located on the anterior vaginal wall.  Two Allis clamps were placed at the level of the midurethra. 1% lidocaine  with epinephrine  was injected into the vaginal mucosa. A vertical incision was made between the two clamps using a 15-blade scalpel.  Using sharp dissection, Metzenbaum scissors were used to make a periurethral tunnel from the vaginal incision towards the pubic rami bilaterally for the future sling tracts. The bladder was ensured to be empty. The trocar and attached sling were introduced into the right side of the periurethral vaginal incision, just inferior to the pubic symphysis on the right side.  The trocar was guided through the endopelvic fascia and directly vertically.  While hugging the cephalad surface of the pubic bone, the trocar was guided out through the abdomen 2 fingerbreadths lateral to midline at the level of the pubic symphysis on the ipsilateral side. The trocar was placed on the left side in a similar fashion.  A 70-degree cystoscope was introduced, and 360-degree inspection revealed no trauma or trocars in the bladder, with brisk bilateral ureteral efflux.  The bladder was drained and the cystoscope was removed.  The Foley catheter was reinserted.  The sling was brought to lie beneath the mid-urethra.  A needle driver was placed behind the sling to ensure no tension.   The plastic sheath was removed from the sling and the distal ends of the sling were trimmed just below the level of the skin incisions.  Tension-free positioning of the sling was confirmed. Vaginal inspection revealed no vaginotomy or sling perforations of the mucosa.  The vaginal mucosal edges were reapproximated using 2-0 Vicryl.  The vagina was copiously irrigated.  Hemostasis was again noted. Vaginal packing not placed. The suprapubic sling incisions were closed with Dermabond. The patient tolerated the procedure well.  She was awakened from anesthesia and transferred to the recovery room in stable condition. All needle and sponge counts were correct x 2.    [name], MD

## 2024-07-24 ENCOUNTER — Encounter (HOSPITAL_COMMUNITY): Payer: Self-pay | Admitting: Obstetrics and Gynecology

## 2024-07-24 ENCOUNTER — Telehealth: Payer: Self-pay | Admitting: Obstetrics and Gynecology

## 2024-07-24 NOTE — Telephone Encounter (Signed)
 Reality  underwent Creation, Urethral Sling, Retropubic Approach, Using Polypropylene Tape and Cystoscopy  on 07/23/2024  with [x] Dr Marilynne [] Dr Guadlupe.  The patient reports that her pain is controlled.  She is taking [] No Medication [x] Acetaminophen  500mg  every 6 hours [x] Ibuprofen  600mg  every 6 hours or []  Prescribed Narcotic.  Her pain level is 1[x] with medication [] Without medication is.   She reports vaginal bleeding.  The patient is tolerating PO fluids and solids. She has not had a bowel movement and is taking Miralax  for a bowel regimen. She is passing gas.  She was discharged with a catheter.   []  Discharged without a catheter, the patient does feel as if she is emptying her bladder.  [] Discharged with a catheter, the patient is not having any concerns with her catheter.  She will return for a voiding trial. [] Verified scheduled date and time with patient.  She does not having any additional questions.  Reviewed Post operative instructions as needed to answer additional questions.   CC'd note to patient's provider.

## 2024-07-24 NOTE — Telephone Encounter (Signed)
 Rebecca Alvarado underwent midurethral sling, cystoscopy on 07/24/24.   Rebecca Alvarado passed Rebecca Alvarado voiding trial.  was backfilled into the bladder Voided  PVR by bladder scan was 0ml.   Rebecca Alvarado was discharged without a catheter. Please call Rebecca Alvarado for a routine post op check. Thanks!  Rebecca LOISE Caper, MD

## 2024-07-31 ENCOUNTER — Ambulatory Visit: Admitting: Podiatry

## 2024-08-07 ENCOUNTER — Ambulatory Visit: Admitting: Podiatry

## 2024-08-19 ENCOUNTER — Ambulatory Visit: Admitting: Podiatry

## 2024-09-03 ENCOUNTER — Encounter: Admitting: Obstetrics and Gynecology

## 2024-11-20 ENCOUNTER — Ambulatory Visit: Admitting: Family Medicine
